# Patient Record
Sex: Female | Born: 1995 | Race: Black or African American | Hispanic: No | Marital: Single | Smoking: Never smoker
Health system: Southern US, Community
[De-identification: ages and names within clinical notes are randomized; demographics above are authoritative.]

## PROBLEM LIST (undated history)

## (undated) DIAGNOSIS — Z5189 Encounter for other specified aftercare: Secondary | ICD-10-CM

## (undated) DIAGNOSIS — D649 Anemia, unspecified: Secondary | ICD-10-CM

## (undated) HISTORY — DX: Encounter for other specified aftercare: Z51.89

## (undated) HISTORY — DX: Anemia, unspecified: D64.9

## (undated) HISTORY — PX: OTHER SURGICAL HISTORY: SHX169

---
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
## 2014-06-08 ENCOUNTER — Ambulatory Visit: Payer: BC Managed Care – PPO | Admitting: Family Medicine

## 2014-06-29 ENCOUNTER — Encounter (HOSPITAL_COMMUNITY): Payer: Self-pay | Admitting: Physical Medicine and Rehabilitation

## 2014-06-29 ENCOUNTER — Emergency Department (HOSPITAL_COMMUNITY): Payer: BC Managed Care – PPO

## 2014-06-29 ENCOUNTER — Emergency Department (HOSPITAL_COMMUNITY)
Admission: EM | Admit: 2014-06-29 | Discharge: 2014-06-29 | Disposition: A | Discharge status: Home / Self Care | Payer: BC Managed Care – PPO | Attending: Emergency Medicine | Admitting: Emergency Medicine

## 2014-06-29 DIAGNOSIS — R109 Unspecified abdominal pain: Secondary | ICD-10-CM | POA: Diagnosis present

## 2014-06-29 DIAGNOSIS — N83201 Unspecified ovarian cyst, right side: Secondary | ICD-10-CM

## 2014-06-29 DIAGNOSIS — N938 Other specified abnormal uterine and vaginal bleeding: Secondary | ICD-10-CM

## 2014-06-29 DIAGNOSIS — Z3202 Encounter for pregnancy test, result negative: Secondary | ICD-10-CM | POA: Insufficient documentation

## 2014-06-29 DIAGNOSIS — D500 Iron deficiency anemia secondary to blood loss (chronic): Secondary | ICD-10-CM | POA: Diagnosis not present

## 2014-06-29 DIAGNOSIS — N8329 Other ovarian cysts: Secondary | ICD-10-CM | POA: Insufficient documentation

## 2014-06-29 DIAGNOSIS — D62 Acute posthemorrhagic anemia: Secondary | ICD-10-CM

## 2014-06-29 LAB — CBC WITH DIFFERENTIAL/PLATELET
BASOS ABS: 0 10*3/uL (ref 0.0–0.1)
Basophils Relative: 1 % (ref 0–1)
EOS ABS: 0 10*3/uL (ref 0.0–0.7)
EOS PCT: 0 % (ref 0–5)
HCT: 27.4 % — ABNORMAL LOW (ref 36.0–46.0)
Hemoglobin: 8.3 g/dL — ABNORMAL LOW (ref 12.0–15.0)
Lymphocytes Relative: 33 % (ref 12–46)
Lymphs Abs: 1.5 10*3/uL (ref 0.7–4.0)
MCH: 23.8 pg — AB (ref 26.0–34.0)
MCHC: 30.3 g/dL (ref 30.0–36.0)
MCV: 78.5 fL (ref 78.0–100.0)
Monocytes Absolute: 0.6 10*3/uL (ref 0.1–1.0)
Monocytes Relative: 12 % (ref 3–12)
Neutro Abs: 2.6 10*3/uL (ref 1.7–7.7)
Neutrophils Relative %: 54 % (ref 43–77)
PLATELETS: 409 10*3/uL — AB (ref 150–400)
RBC: 3.49 MIL/uL — ABNORMAL LOW (ref 3.87–5.11)
RDW: 16.7 % — AB (ref 11.5–15.5)
WBC: 4.7 10*3/uL (ref 4.0–10.5)

## 2014-06-29 LAB — WET PREP, GENITAL
CLUE CELLS WET PREP: NONE SEEN
TRICH WET PREP: NONE SEEN
YEAST WET PREP: NONE SEEN

## 2014-06-29 LAB — TYPE AND SCREEN
ABO/RH(D): A POS
ANTIBODY SCREEN: NEGATIVE

## 2014-06-29 LAB — URINE MICROSCOPIC-ADD ON

## 2014-06-29 LAB — URINALYSIS, ROUTINE W REFLEX MICROSCOPIC
Bilirubin Urine: NEGATIVE
Glucose, UA: NEGATIVE mg/dL
Ketones, ur: NEGATIVE mg/dL
LEUKOCYTES UA: NEGATIVE
Nitrite: NEGATIVE
PH: 7 (ref 5.0–8.0)
Protein, ur: 30 mg/dL — AB
Specific Gravity, Urine: 1.011 (ref 1.005–1.030)
Urobilinogen, UA: 0.2 mg/dL (ref 0.0–1.0)

## 2014-06-29 LAB — COMPREHENSIVE METABOLIC PANEL
ALT: 17 U/L (ref 0–35)
AST: 17 U/L (ref 0–37)
Albumin: 4.2 g/dL (ref 3.5–5.2)
Alkaline Phosphatase: 71 U/L (ref 39–117)
Anion gap: 13 (ref 5–15)
BUN: 5 mg/dL — ABNORMAL LOW (ref 6–23)
CO2: 21 meq/L (ref 19–32)
Calcium: 9.5 mg/dL (ref 8.4–10.5)
Chloride: 104 mEq/L (ref 96–112)
Creatinine, Ser: 0.5 mg/dL (ref 0.50–1.10)
GFR calc Af Amer: >90 mL/min (ref >90)
Glucose, Bld: 86 mg/dL (ref 70–99)
Potassium: 3.9 mEq/L (ref 3.7–5.3)
SODIUM: 138 meq/L (ref 137–147)
Total Bilirubin: 0.5 mg/dL (ref 0.3–1.2)
Total Protein: 7.4 g/dL (ref 6.0–8.3)

## 2014-06-29 LAB — PREGNANCY, URINE: Preg Test, Ur: NEGATIVE

## 2014-06-29 MED ORDER — MEGESTROL ACETATE 40 MG PO TABS: 120.0000 mg | ORAL_TABLET | Freq: Three times a day (TID) | ORAL | Status: DC

## 2014-06-29 MED ORDER — HYDROCODONE-ACETAMINOPHEN 5-325 MG PO TABS: 1.0000 | ORAL_TABLET | ORAL | Status: DC | PRN

## 2014-06-29 MED ORDER — MEGESTROL ACETATE 40 MG PO TABS
120.0000 mg | ORAL_TABLET | Freq: Once | ORAL | Status: AC
  Administered 2014-06-29: 120 mg via ORAL
  Filled 2014-06-29: qty 3

## 2014-06-29 MED ORDER — HYDROCODONE-ACETAMINOPHEN 5-325 MG PO TABS
1.0000 | ORAL_TABLET | Freq: Once | ORAL | Status: AC
  Administered 2014-06-29: 1 via ORAL
  Filled 2014-06-29: qty 1

## 2014-06-29 NOTE — ED Notes (Signed)
Patient returned from CT

## 2014-06-29 NOTE — ED Notes (Signed)
Pt presents to department for evaluation of R sided abdominal pain, anemia, and hematuria. Ongoing x3 days. 9/10 pain upon arrival. Was seen at school physician today, abnormal labs noted, referred to ED. Hemoglobin of 8.6, large amount of blood in urine. Pt is alert and oriented x4.

## 2014-06-29 NOTE — ED Provider Notes (Signed)
CSN: [id number]     Arrival date & time 06/29/14  1338 History   First MD Initiated Contact with Patient 06/29/14 1437     Chief Complaint  Patient presents with  . Abdominal Pain  . Hematuria     (Consider location/radiation/quality/duration/timing/severity/associated sxs/prior Treatment) HPI Patient with H/O recurrent anemia of unknown source. She reports H/O hematology consult, GI consult with upper and lower endoscopy. Denies H/O heavy menses. Last menses 11/26, denies excessive bleeding or clots. Reports recently getting more fatigue and dizziness with activity. For several days has had moderated RLQ pain. Has H/O ovarian cyst. Today noted blood in urine.  Initially the patient did not endorse any significant amount of menstrual bleeding. When we prepared to do the pelvic examination she removed a pad and reported she had blood dripping down her legs. She then reported that she had had vaginal bleeding since yesterday and has changed her pad twice today. Past Medical History  Diagnosis Date  . Anemia     4 admissions for blood transfusions.  . Blood transfusion without reported diagnosis     s/p 4 blood transfusions since age 54.   Past Surgical History  Procedure Laterality Date  . Admissions      4 separate blood transfusions for severe anemia requiring blood transfusions.  . Esophagogastroduodenoscopy  10/2012    negative; [place]; admission for severe anemia requiring blood transfusion.  . Colonoscopy  10/2012    negative; admission for severe anemia requiring blood transfusion.   No family history on file. History  Substance Use Topics  . Smoking status: Never Smoker   . Smokeless tobacco: Never Used  . Alcohol Use: No   OB History    No data available     Review of Systems   10 Systems reviewed and are negative for acute change except as noted in the HPI. Allergies  Review of patient's allergies indicates no known allergies.  Home Medications   Prior to  Admission medications   Medication Sig Start Date End Date Taking? Authorizing Provider  acetaminophen (TYLENOL) 325 MG tablet Take 650 mg by mouth every 6 (six) hours as needed for mild pain or fever.   Yes Historical Provider, MD  ferrous sulfate 325 (65 FE) MG tablet Take 1 tablet (325 mg total) by mouth 3 (three) times daily. 08/22/13  Yes Jamal Collin, MD  ciprofloxacin (CIPRO) 250 MG tablet Take 1 tablet (250 mg total) by mouth 2 (two) times daily. Take for 3 days Patient not taking: Reported on 06/29/2014 10/16/13   Lonia Skinner, MD  HYDROcodone-acetaminophen (NORCO/VICODIN) 5-325 MG per tablet Take 1-2 tablets by mouth every 4 (four) hours as needed for moderate pain or severe pain. 06/29/14   Arby Barrette, MD  megestrol (MEGACE) 40 MG tablet Take 3 tablets (120 mg total) by mouth 3 (three) times daily. 3 tabs TID for 5 days, 2 tabs BID for 5 days, 1 daily until seen for recheck. 06/29/14   Arby Barrette, MD   BP 111/64 mmHg  Pulse 80  Temp(Src) 97.9 F (36.6 C) (Axillary)  Resp 18  Ht 5\' 5"  (1.651 m)  Wt 115 lb (52.164 kg)  BMI 19.14 kg/m2  SpO2 100% Physical Exam  Constitutional: She is oriented to person, place, and time. She appears well-developed and well-nourished.  Well appearance. No distress.  HENT:  Head: Normocephalic and atraumatic.  Eyes: EOM are normal. Pupils are equal, round, and reactive to light.  Neck: Neck supple.  Cardiovascular: Normal rate,  regular rhythm, normal heart sounds and intact distal pulses.   Pulmonary/Chest: Effort normal and breath sounds normal.  Abdominal: Soft. Bowel sounds are normal. She exhibits no distension. There is tenderness (mildly reproducible RLQ pain. No guarding.).  Genitourinary:  Vaginal examination: Speculum pooling blood in the vaginal vault. This is red and thin consistent with menstrual bleeding. There are no clots present. The os is closed.  Musculoskeletal: Normal range of motion. She exhibits no edema.   Neurological: She is alert and oriented to person, place, and time. She has normal strength. Coordination normal. GCS eye subscore is 4. GCS verbal subscore is 5. GCS motor subscore is 6.  Skin: Skin is warm, dry and intact.  Psychiatric: She has a normal mood and affect.    ED Course  Procedures (including critical care time) Labs Review Labs Reviewed  WET PREP, GENITAL - Abnormal; Notable for the following:    WBC, Wet Prep HPF POC FEW (*)    All other components within normal limits  URINALYSIS, ROUTINE W REFLEX MICROSCOPIC - Abnormal; Notable for the following:    Color, Urine RED (*)    APPearance TURBID (*)    Hgb urine dipstick LARGE (*)    Protein, ur 30 (*)    All other components within normal limits  COMPREHENSIVE METABOLIC PANEL - Abnormal; Notable for the following:    BUN 5 (*)    All other components within normal limits  CBC WITH DIFFERENTIAL - Abnormal; Notable for the following:    RBC 3.49 (*)    Hemoglobin 8.3 (*)    HCT 27.4 (*)    MCH 23.8 (*)    RDW 16.7 (*)    Platelets 409 (*)    All other components within normal limits  URINE MICROSCOPIC-ADD ON - Abnormal; Notable for the following:    Squamous Epithelial / LPF FEW (*)    All other components within normal limits  GC/CHLAMYDIA PROBE AMP  PREGNANCY, URINE  TYPE AND SCREEN    Imaging Review Ct Renal Stone Study  06/29/2014   CLINICAL DATA:  Right flank pain of 3 days duration. Gross hematuria last night. Vomiting today.  EXAM: CT ABDOMEN AND PELVIS WITHOUT CONTRAST  TECHNIQUE: Multidetector CT imaging of the abdomen and pelvis was performed following the standard protocol without IV contrast.  COMPARISON:  None.  FINDINGS: Lung bases are clear. No pleural or pericardial fluid. The liver, spleen, gallbladder, pancreas, adrenal glands, aorta and IVC appear normal without contrast. No bowel pathology is seen. Gas-filled appendix appears unremarkable.  Both kidneys appear normal. No renal calculi. No  hydroureteronephrosis. No stones seen along the course of either ureter.  No bony abnormality.  No stone in the bladder. Uterus appears unremarkable. 3 cm cyst associated with the right ovary, presumably functional.  IMPRESSION: No evidence of urinary tract stone disease. No cause of hematuria identified.  3 cm right ovarian cyst, presumably functional.   Electronically Signed   By: Paulina FusiMark  Shogry M.D.   On: 06/29/2014 16:36     EKG Interpretation None     Patient's case is reviewed with Dr[facility] Adrian [facility]Stinson at [facility]. The patient will be started on Megace with a tapering dose. She will be contacted to schedule a follow-up GYN appointment. MDM   Final diagnoses:  Dysfunctional uterine bleeding  Anemia due to acute blood loss  Cyst of right ovary   The patient is not pregnant. She is not orthostatic. At this point it does appear that the source of  blood loss is vaginal bleeding. The patient will be treated with Megace tapering. She'll be given signs and symptoms for which to return.    Arby BarretteMarcy Jaedan Schuman, MD 06/29/14 [phone]

## 2014-06-29 NOTE — Discharge Instructions (Signed)
Abnormal Uterine Bleeding Abnormal uterine bleeding means bleeding from the vagina that is not your normal menstrual period. This can be:  Bleeding or spotting between periods.  Bleeding after sex (sexual intercourse).  Bleeding that is heavier or more than normal.  Periods that last longer than usual.  Bleeding after menopause. There are many problems that may cause this. Treatment will depend on the cause of the bleeding. Any kind of bleeding that is not normal should be reviewed by your doctor.  HOME CARE Watch your condition for any changes. These actions may lessen any discomfort you are having:  Do not use tampons or douches as told by your doctor.  Change your pads often. You should get regular pelvic exams and Pap tests. Keep all appointments for tests as told by your doctor. GET HELP IF:  You are bleeding for more than 1 week.  You feel dizzy at times. GET HELP RIGHT AWAY IF:   You pass out.  You have to change pads every 15 to 30 minutes.  You have belly pain.  You have a fever.  You become sweaty or weak.  You are passing large blood clots from the vagina.  You feel sick to your stomach (nauseous) and throw up (vomit). MAKE SURE YOU:  Understand these instructions.  Will watch your condition.  Will get help right away if you are not doing well or get worse. Document Released: 05/07/2009 Document Revised: 07/15/2013 Document Reviewed: 02/06/2013 [facility] Patient Information 2015 [place], [place]. This information is not intended to replace advice given to you by your health care provider. Make sure you discuss any questions you have with your health care provider.  Anemia, Nonspecific Anemia is a condition in which the concentration of red blood cells or hemoglobin in the blood is below normal. Hemoglobin is a substance in red blood cells that carries oxygen to the tissues of the body. Anemia results in not enough oxygen reaching these tissues.  CAUSES    Common causes of anemia include:   Excessive bleeding. Bleeding may be internal or external. This includes excessive bleeding from periods (in women) or from the intestine.   Poor nutrition.   Chronic kidney, thyroid, and liver disease.  Bone marrow disorders that decrease red blood cell production.  Cancer and treatments for cancer.  HIV, AIDS, and their treatments.  Spleen problems that increase red blood cell destruction.  Blood disorders.  Excess destruction of red blood cells due to infection, medicines, and autoimmune disorders. SIGNS AND SYMPTOMS   Minor weakness.   Dizziness.   Headache.  Palpitations.   Shortness of breath, especially with exercise.   Paleness.  Cold sensitivity.  Indigestion.  Nausea.  Difficulty sleeping.  Difficulty concentrating. Symptoms may occur suddenly or they may develop slowly.  DIAGNOSIS  Additional blood tests are often needed. These help your health care provider determine the best treatment. Your health care provider will check your stool for blood and look for other causes of blood loss.  TREATMENT  Treatment varies depending on the cause of the anemia. Treatment can include:   Supplements of iron, vitamin H41, or folic acid.   Hormone medicines.   A blood transfusion. This may be needed if blood loss is severe.   Hospitalization. This may be needed if there is significant continual blood loss.   Dietary changes.  Spleen removal. HOME CARE INSTRUCTIONS Keep all follow-up appointments. It often takes many weeks to correct anemia, and having your health care provider check on your  condition and your response to treatment is very important. °SEEK IMMEDIATE MEDICAL CARE IF:  °· You develop extreme weakness, shortness of breath, or chest pain.   °· You become dizzy or have trouble concentrating. °· You develop heavy vaginal bleeding.   °· You develop a rash.   °· You have bloody or black, tarry stools.    °· You faint.   °· You vomit up blood.   °· You vomit repeatedly.   °· You have abdominal pain. °· You have a fever or persistent symptoms for more than 2-3 days.   °· You have a fever and your symptoms suddenly get worse.   °· You are dehydrated.   °MAKE SURE YOU: °· Understand these instructions. °· Will watch your condition. °· Will get help right away if you are not doing well or get worse. °Document Released: 08/17/2004 Document Revised: 03/12/2013 Document Reviewed: 01/03/2013 °[employer]® Patient Information ©2015 [employer]. This information is not intended to replace advice given to you by your health care provider. Make sure you discuss any questions you have with your health care provider. ° °

## 2014-06-29 NOTE — ED Notes (Signed)
MD Pfieffer at the bedside.  

## 2014-06-30 ENCOUNTER — Encounter: Payer: Self-pay | Admitting: Obstetrics & Gynecology

## 2014-07-01 LAB — GC/CHLAMYDIA PROBE AMP
CT Probe RNA: NEGATIVE
GC Probe RNA: NEGATIVE

## 2014-07-22 ENCOUNTER — Encounter: Payer: BC Managed Care – PPO | Admitting: Obstetrics & Gynecology

## 2014-10-20 ENCOUNTER — Observation Stay (HOSPITAL_COMMUNITY)
Admission: EM | Admit: 2014-10-20 | Discharge: 2014-10-22 | Disposition: A | Discharge status: Home / Self Care | Payer: BLUE CROSS/BLUE SHIELD | Attending: Internal Medicine | Admitting: Internal Medicine

## 2014-10-20 ENCOUNTER — Encounter (HOSPITAL_COMMUNITY): Payer: Self-pay | Admitting: Emergency Medicine

## 2014-10-20 ENCOUNTER — Other Ambulatory Visit (HOSPITAL_COMMUNITY): Payer: Self-pay

## 2014-10-20 DIAGNOSIS — G43909 Migraine, unspecified, not intractable, without status migrainosus: Secondary | ICD-10-CM | POA: Insufficient documentation

## 2014-10-20 DIAGNOSIS — R42 Dizziness and giddiness: Secondary | ICD-10-CM | POA: Diagnosis not present

## 2014-10-20 DIAGNOSIS — D509 Iron deficiency anemia, unspecified: Secondary | ICD-10-CM | POA: Diagnosis not present

## 2014-10-20 DIAGNOSIS — D649 Anemia, unspecified: Secondary | ICD-10-CM

## 2014-10-20 LAB — RAPID URINE DRUG SCREEN, HOSP PERFORMED
Amphetamines: NOT DETECTED
BENZODIAZEPINES: NOT DETECTED
Barbiturates: NOT DETECTED
Cocaine: NOT DETECTED
Opiates: NOT DETECTED
Tetrahydrocannabinol: NOT DETECTED

## 2014-10-20 LAB — BASIC METABOLIC PANEL
Anion gap: 5 (ref 5–15)
BUN: 8 mg/dL (ref 6–23)
CO2: 25 mmol/L (ref 19–32)
Calcium: 8.8 mg/dL (ref 8.4–10.5)
Chloride: 108 mmol/L (ref 96–112)
Creatinine, Ser: 0.55 mg/dL (ref 0.50–1.10)
GFR calc Af Amer: >90 mL/min (ref >90)
GLUCOSE: 88 mg/dL (ref 70–99)
Potassium: 4 mmol/L (ref 3.5–5.1)
Sodium: 138 mmol/L (ref 135–145)

## 2014-10-20 LAB — ABO/RH: ABO/RH(D): A POS

## 2014-10-20 LAB — CBC
HEMATOCRIT: 25.9 % — AB (ref 36.0–46.0)
HEMOGLOBIN: 7.3 g/dL — AB (ref 12.0–15.0)
MCH: 20.8 pg — ABNORMAL LOW (ref 26.0–34.0)
MCHC: 28.2 g/dL — AB (ref 30.0–36.0)
MCV: 73.8 fL — ABNORMAL LOW (ref 78.0–100.0)
Platelets: 275 10*3/uL (ref 150–400)
RBC: 3.51 MIL/uL — ABNORMAL LOW (ref 3.87–5.11)
RDW: 21.5 % — AB (ref 11.5–15.5)
WBC: 4.4 10*3/uL (ref 4.0–10.5)

## 2014-10-20 LAB — PREGNANCY, URINE: PREG TEST UR: NEGATIVE

## 2014-10-20 LAB — PREPARE RBC (CROSSMATCH)

## 2014-10-20 MED ORDER — SODIUM CHLORIDE 0.9 % IV SOLN
Freq: Once | INTRAVENOUS | Status: AC
  Administered 2014-10-20: via INTRAVENOUS

## 2014-10-20 MED ORDER — ONDANSETRON HCL 4 MG/2ML IJ SOLN
4.0000 mg | Freq: Four times a day (QID) | INTRAMUSCULAR | Status: DC | PRN
  Administered 2014-10-21: 4 mg via INTRAVENOUS

## 2014-10-20 MED ORDER — SODIUM CHLORIDE 0.9 % IV SOLN
INTRAVENOUS | Status: AC
  Administered 2014-10-20 – 2014-10-21 (×2): via INTRAVENOUS

## 2014-10-20 MED ORDER — ACETAMINOPHEN 650 MG RE SUPP: 650.0000 mg | Freq: Four times a day (QID) | RECTAL | Status: DC | PRN

## 2014-10-20 MED ORDER — ONDANSETRON HCL 4 MG PO TABS: 4.0000 mg | ORAL_TABLET | Freq: Four times a day (QID) | ORAL | Status: DC | PRN

## 2014-10-20 MED ORDER — FERROUS SULFATE 325 (65 FE) MG PO TABS
325.0000 mg | ORAL_TABLET | Freq: Three times a day (TID) | ORAL | Status: DC
  Administered 2014-10-20 – 2014-10-22 (×5): 325 mg via ORAL
  Filled 2014-10-20 (×7): qty 1

## 2014-10-20 MED ORDER — ONDANSETRON HCL 4 MG/2ML IJ SOLN
4.0000 mg | Freq: Three times a day (TID) | INTRAMUSCULAR | Status: AC | PRN
Start: 2014-10-20 — End: 2014-10-21
  Filled 2014-10-20: qty 2

## 2014-10-20 MED ORDER — ACETAMINOPHEN 325 MG PO TABS
650.0000 mg | ORAL_TABLET | Freq: Four times a day (QID) | ORAL | Status: DC | PRN
  Administered 2014-10-21 – 2014-10-22 (×3): 650 mg via ORAL
  Filled 2014-10-20 (×3): qty 2

## 2014-10-20 MED ORDER — ENSURE ENLIVE PO LIQD
237.0000 mL | Freq: Two times a day (BID) | ORAL | Status: DC
  Administered 2014-10-21 – 2014-10-22 (×3): 237 mL via ORAL

## 2014-10-20 NOTE — ED Notes (Signed)
Pt states she was seen by her doctor on Friday and her hemoglobin was 8.2 and her iron level was low  Pt states they recommend that she see a hematologist Pt states today she has been feeling dizzy and seeing black spots so she called her dr and was told to come here

## 2014-10-20 NOTE — H&P (Addendum)
[facility] History and Physical  Lindsey RabonSanobria Szczepanik WUJ:[id number] DOB: 1995-10-28 DOA: 10/20/2014  Referring physician: ER physician. PCP: Saralyn PilarKaramalegos, Alexander, DO   Chief Complaint: Dizziness.  HPI: Lindsey Acevedo is a 19 y.o. female history of chronic iron deficiency anemia and has had previous extensive workup including colonoscopy and had [place] presents to the ER because of dizziness and weakness. Patient states that over the last 2-3 days patient has been feeling weak and dizzy and also had some blurred vision today. 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 Patient denies any chest pain shortness of breath headache nausea vomiting diarrhea focal deficits. In the ER patient is found to be orthostatic. Patient's hemoglobin is around 7.3 at admission and the previous one was around 8. Patient has been taking iron pills despite which patient's hemoglobin has not improved. Patient denies any menorrhagia or rectal bleeding.  Review of Systems: As presented in the history of presenting illness, rest negative.  Past Medical History  Diagnosis Date  . Anemia     4 admissions for blood transfusions.  . Blood transfusion without reported diagnosis     s/p 4 blood transfusions since age 19.   Past Surgical History  Procedure Laterality Date  . Admissions      4 separate blood transfusions for severe anemia requiring blood transfusions.  . Esophagogastroduodenoscopy  10/2012    negative; [place]; admission for severe anemia requiring blood transfusion.  . Colonoscopy  10/2012    negative; admission for severe anemia requiring blood transfusion.   Social History:  reports that she has never smoked. She has never used smokeless tobacco. She reports that she does not drink alcohol or use illicit drugs. Where does patient live home. Can patient participate in ADLs? Yes.  No Known Allergies  Family  History:  Family History  Problem Relation Age of Onset  . Colon cancer Neg Hx   . Hypertension Maternal Grandmother       Prior to Admission medications   Medication Sig Start Date End Date Taking? Authorizing Provider  acetaminophen (TYLENOL) 325 MG tablet Take 650 mg by mouth every 6 (six) hours as needed for mild pain or fever (pain).    Yes Historical Provider, MD  ferrous sulfate 325 (65 FE) MG tablet Take 1 tablet (325 mg total) by mouth 3 (three) times daily. 08/22/13  Yes Jamal CollinJames R Joyner, MD  ciprofloxacin (CIPRO) 250 MG tablet Take 1 tablet (250 mg total) by mouth 2 (two) times daily. Take for 3 days Patient not taking: Reported on 06/29/2014 10/16/13   Lonia SkinnerStephanie E Losq, MD  HYDROcodone-acetaminophen (NORCO/VICODIN) 5-325 MG per tablet Take 1-2 tablets by mouth every 4 (four) hours as needed for moderate pain or severe pain. Patient not taking: Reported on 10/20/2014 06/29/14   Arby BarretteMarcy Pfeiffer, MD  megestrol (MEGACE) 40 MG tablet Take 3 tablets (120 mg total) by mouth 3 (three) times daily. 3 tabs TID for 5 days, 2 tabs BID for 5 days, 1 daily until seen for recheck. Patient not taking: Reported on 10/20/2014 06/29/14   Arby BarretteMarcy Pfeiffer, MD    Physical Exam: Filed Vitals:   10/20/14 1917  BP: 115/62  Pulse: 83  Temp: 98.4 F (36.9 C)  TempSrc: Oral  Resp: 16  Height: 5\' 5"  (1.651 m)  Weight: 53.978 kg (119 lb)  SpO2: 100%     General:  Moderately built and nourished.  Eyes: Anicteric pallor present.  ENT:  No discharge from the ears eyes nose or mouth.  Neck: No mass felt.  Cardiovascular: S1-S2 heard.  Respiratory: No rhonchi or crepitations.  Abdomen: Soft nontender bowel sounds present.  Skin: No rash.  Musculoskeletal: No edema.  Psychiatric: Appears normal.  Neurologic: Alert awake oriented to time place and person. Moves all extremities.  Labs on Admission:  Basic Metabolic Panel:  Recent Labs Lab 10/20/14 1928  NA 138  K 4.0  CL 108  CO2 25   GLUCOSE 88  BUN 8  CREATININE 0.55  CALCIUM 8.8   Liver Function Tests: No results for input(s): AST, ALT, ALKPHOS, BILITOT, PROT, ALBUMIN in the last 168 hours. No results for input(s): LIPASE, AMYLASE in the last 168 hours. No results for input(s): AMMONIA in the last 168 hours. CBC:  Recent Labs Lab 10/20/14 1928  WBC 4.4  HGB 7.3*  HCT 25.9*  MCV 73.8*  PLT 275   Cardiac Enzymes: No results for input(s): CKTOTAL, CKMB, CKMBINDEX, TROPONINI in the last 168 hours.  BNP (last 3 results) No results for input(s): BNP in the last 8760 hours.  ProBNP (last 3 results) No results for input(s): PROBNP in the last 8760 hours.  CBG: No results for input(s): GLUCAP in the last 168 hours.  Radiological Exams on Admission: No results found.   Assessment/Plan Active Problems:   Iron deficiency anemia   Dizziness   1. Dizziness secondary to symptomatic anemia - at this time I have discussed the on-call hematologist Dr. Clelia Croft. Since patient is symptomatic at this time we will transfuse 1 unit of PRBC and continue with gentle hydration and recheck CBC in a.m. Closely monitor in telemetry to rule out any arrhythmias. Recheck orthostatics in a.m. after transfusion. Dr. Clelia Croft has advised to have patient follow up with hematologist as outpatient and may need IV iron therapy and further workup once patient visits hematology office. 2. Chronic iron deficiency anemia - see #1.  Addendum - EKG shows sinus arrhythmia.  DVT Prophylaxis SCDs.  Code Status: Full code.  Family Communication: None.  Disposition Plan: Admit for observation.    Park Beck N. [facility] Pager [phone].  If 7PM-7AM, please contact night-coverage [email or web address] Password [facility] 10/20/2014, 10:05 PM

## 2014-10-20 NOTE — ED Provider Notes (Signed)
CSN: [id number]     Arrival date & time 10/20/14  1908 History   First MD Initiated Contact with Patient 10/20/14 2048     Chief Complaint  Patient presents with  . Dizziness     (Consider location/radiation/quality/duration/timing/severity/associated sxs/prior Treatment) HPI Comments: The patient is an 19 year old female, she has a history of anemia which appears to be iron deficiency in the past, she has been seen and evaluated by gastroenterology, hematology, she is originally from [place] and is here at school. Since being here she has required a transfusion of blood products one year ago when she was admitted to the hospital for symptomatically anemia. She had a blood draw on Friday, reports that her hemoglobin was 8.2, over the last 24 hours she has had increased lightheadedness and almost had a syncopal event while she was driving after seeing black spots. At this time the patient feels generally weak and fatigued, denies any other symptoms including fevers, chills, nausea, vomiting, headache, chest pain, cough, shortness of breath, abdominal pain, dysuria or diarrhea, no rashes or swelling, no numbness, no focal weakness. She does report having normal menstrual cycles, this is regular, monthly, last 4 days, changes 3 tampons or pads per day. There has been no change. She was also told that her iron level was low and that she was not holding onto this by oral route and may need IV iron.   Patient is a 19 y.o. female presenting with dizziness. The history is provided by the patient.  Dizziness   Past Medical History  Diagnosis Date  . Anemia     4 admissions for blood transfusions.  . Blood transfusion without reported diagnosis     s/p 4 blood transfusions since age 62.   Past Surgical History  Procedure Laterality Date  . Admissions      4 separate blood transfusions for severe anemia requiring blood transfusions.  . Esophagogastroduodenoscopy  10/2012    negative; [place];  admission for severe anemia requiring blood transfusion.  . Colonoscopy  10/2012    negative; admission for severe anemia requiring blood transfusion.   History reviewed. No pertinent family history. History  Substance Use Topics  . Smoking status: Never Smoker   . Smokeless tobacco: Never Used  . Alcohol Use: No   OB History    No data available     Review of Systems  Neurological: Positive for dizziness.  All other systems reviewed and are negative.     Allergies  Review of patient's allergies indicates no known allergies.  Home Medications   Prior to Admission medications   Medication Sig Start Date End Date Taking? Authorizing Provider  acetaminophen (TYLENOL) 325 MG tablet Take 650 mg by mouth every 6 (six) hours as needed for mild pain or fever (pain).    Yes Historical Provider, MD  ferrous sulfate 325 (65 FE) MG tablet Take 1 tablet (325 mg total) by mouth 3 (three) times daily. 08/22/13  Yes Jamal Collin, MD  ciprofloxacin (CIPRO) 250 MG tablet Take 1 tablet (250 mg total) by mouth 2 (two) times daily. Take for 3 days Patient not taking: Reported on 06/29/2014 10/16/13   Lonia Skinner, MD  HYDROcodone-acetaminophen (NORCO/VICODIN) 5-325 MG per tablet Take 1-2 tablets by mouth every 4 (four) hours as needed for moderate pain or severe pain. Patient not taking: Reported on 10/20/2014 06/29/14   Arby Barrette, MD  megestrol (MEGACE) 40 MG tablet Take 3 tablets (120 mg total) by mouth 3 (three) times daily.  3 tabs TID for 5 days, 2 tabs BID for 5 days, 1 daily until seen for recheck. Patient not taking: Reported on 10/20/2014 06/29/14   Arby BarretteMarcy Pfeiffer, MD   BP 115/62 mmHg  Pulse 83  Temp(Src) 98.4 F (36.9 C) (Oral)  Resp 16  Ht 5\' 5"  (1.651 m)  Wt 119 lb (53.978 kg)  BMI 19.80 kg/m2  SpO2 100%  LMP 10/18/2014 (Exact Date) Physical Exam  Constitutional: She appears well-developed and well-nourished. No distress.  HENT:  Head: Normocephalic and atraumatic.   Mouth/Throat: Oropharynx is clear and moist. No oropharyngeal exudate.  Eyes: EOM are normal. Pupils are equal, round, and reactive to light. Right eye exhibits no discharge. Left eye exhibits no discharge. No scleral icterus.  Pale conjunctiva  Neck: Normal range of motion. Neck supple. No JVD present. No thyromegaly present.  Cardiovascular: Normal rate, regular rhythm, normal heart sounds and intact distal pulses.  Exam reveals no gallop and no friction rub.   No murmur heard. Pulmonary/Chest: Effort normal and breath sounds normal. No respiratory distress. She has no wheezes. She has no rales.  Abdominal: Soft. Bowel sounds are normal. She exhibits no distension and no mass. There is no tenderness.  Musculoskeletal: Normal range of motion. She exhibits no edema or tenderness.  Lymphadenopathy:    She has no cervical adenopathy.  Neurological: She is alert. Coordination normal.  Skin: Skin is warm and dry. No rash noted. No erythema.  Psychiatric: She has a normal mood and affect. Her behavior is normal.  Nursing note and vitals reviewed.   ED Course  Procedures (including critical care time) Labs Review Labs Reviewed  CBC - Abnormal; Notable for the following:    RBC 3.51 (*)    Hemoglobin 7.3 (*)    HCT 25.9 (*)    MCV 73.8 (*)    MCH 20.8 (*)    MCHC 28.2 (*)    RDW 21.5 (*)    All other components within normal limits  BASIC METABOLIC PANEL  PREGNANCY, URINE    Imaging Review No results found.   MDM   Final diagnoses:  Anemia, unspecified anemia type    The patient appears well, her vital signs are normal, she does have significant anemia with a hemoglobin of 7.3, she will need admission to the hospital for transfusion.  D/w Dr. Toniann Failkakrakandy - willa dmit.    Eber HongBrian Yaman Grauberger, MD 10/20/14 2126

## 2014-10-21 DIAGNOSIS — R42 Dizziness and giddiness: Secondary | ICD-10-CM | POA: Diagnosis not present

## 2014-10-21 DIAGNOSIS — D509 Iron deficiency anemia, unspecified: Secondary | ICD-10-CM | POA: Diagnosis not present

## 2014-10-21 DIAGNOSIS — G43909 Migraine, unspecified, not intractable, without status migrainosus: Secondary | ICD-10-CM | POA: Diagnosis not present

## 2014-10-21 LAB — CBC WITH DIFFERENTIAL/PLATELET
Basophils Absolute: 0 10*3/uL (ref 0.0–0.1)
Basophils Relative: 1 % (ref 0–1)
EOS ABS: 0.1 10*3/uL (ref 0.0–0.7)
EOS PCT: 2 % (ref 0–5)
HCT: 27.4 % — ABNORMAL LOW (ref 36.0–46.0)
Hemoglobin: 8.1 g/dL — ABNORMAL LOW (ref 12.0–15.0)
Lymphocytes Relative: 45 % (ref 12–46)
Lymphs Abs: 2.2 10*3/uL (ref 0.7–4.0)
MCH: 22.5 pg — AB (ref 26.0–34.0)
MCHC: 29.6 g/dL — AB (ref 30.0–36.0)
MCV: 76.1 fL — ABNORMAL LOW (ref 78.0–100.0)
MONO ABS: 0.7 10*3/uL (ref 0.1–1.0)
Monocytes Relative: 14 % — ABNORMAL HIGH (ref 3–12)
NEUTROS ABS: 1.8 10*3/uL (ref 1.7–7.7)
Neutrophils Relative %: 38 % — ABNORMAL LOW (ref 43–77)
Platelets: 251 10*3/uL (ref 150–400)
RBC: 3.6 MIL/uL — ABNORMAL LOW (ref 3.87–5.11)
RDW: 22.4 % — ABNORMAL HIGH (ref 11.5–15.5)
WBC: 4.8 10*3/uL (ref 4.0–10.5)

## 2014-10-21 LAB — COMPREHENSIVE METABOLIC PANEL
ALT: 19 U/L (ref 0–35)
AST: 22 U/L (ref 0–37)
Albumin: 3.9 g/dL (ref 3.5–5.2)
Alkaline Phosphatase: 62 U/L (ref 39–117)
Anion gap: 5 (ref 5–15)
BUN: 11 mg/dL (ref 6–23)
CO2: 22 mmol/L (ref 19–32)
CREATININE: 0.51 mg/dL (ref 0.50–1.10)
Calcium: 8.5 mg/dL (ref 8.4–10.5)
Chloride: 109 mmol/L (ref 96–112)
GFR calc Af Amer: >90 mL/min (ref >90)
Glucose, Bld: 87 mg/dL (ref 70–99)
POTASSIUM: 3.9 mmol/L (ref 3.5–5.1)
Sodium: 136 mmol/L (ref 135–145)
TOTAL PROTEIN: 6.4 g/dL (ref 6.0–8.3)
Total Bilirubin: 0.7 mg/dL (ref 0.3–1.2)

## 2014-10-21 LAB — HEMOGLOBIN AND HEMATOCRIT, BLOOD
HEMATOCRIT: 31 % — AB (ref 36.0–46.0)
HEMOGLOBIN: 9.4 g/dL — AB (ref 12.0–15.0)

## 2014-10-21 LAB — PREPARE RBC (CROSSMATCH)

## 2014-10-21 MED ORDER — SODIUM CHLORIDE 0.9 % IV SOLN
Freq: Once | INTRAVENOUS | Status: AC
Start: 2014-10-21 — End: 2014-10-21

## 2014-10-21 NOTE — Care Management (Signed)
10/21/14 Sandford CrazeNora Khara Renaud RN,BSN,NCM [phone] CM consult for PCP. This CM spoke with pt who states she has a PCP at the [facility], and she needs a Hematologist. This CM informed pt that this would need to be a physician referral. No other CM needs noted.

## 2014-10-21 NOTE — Progress Notes (Signed)
INITIAL NUTRITION ASSESSMENT  DOCUMENTATION CODES Per approved criteria  -Not Applicable   INTERVENTION: - Continue Ensure Enlive po BID, each supplement provides 350 kcal and 20 grams of protein - RD will continue to monitor  NUTRITION DIAGNOSIS: Inadequate oral intake related to nausea as evidenced by poor po and wt loss.   Goal: Pt to meet >/= 90% of their estimated nutrition needs   Monitor:  Weight trend, po intake, acceptance of supplements, labs  Reason for Assessment: Malnutrition Screening Tool  19 y.o. female  Admitting Dx: <principal problem not specified>  ASSESSMENT: 19 y.o. female history of chronic iron deficiency anemia and has had previous extensive workup including colonoscopy and had [place] with hemat[place] presents to the ER because of dizziness and weakness.  - Pt reports poor po due to nausea. 4 lb recent weight loss. Ate one slice of bacon for breakfast and was given an Ensure by RN which she likes. Pt to order chicken salad for lunch. Will continue nutritional supplements to improve po intake.  - No signs of fat or muscle depletion.  - Labs and medications reviewed  Height: Ht Readings from Last 1 Encounters:  10/20/14 5\' 5"  (1.651 m) (61 %*, Z = 0.28)   * Growth percentiles are based on CDC 2-20 Years data.    Weight: Wt Readings from Last 1 Encounters:  10/20/14 116 lb 3.2 oz (52.708 kg) (27 %*, Z = -0.62)   * Growth percentiles are based on CDC 2-20 Years data.    Ideal Body Weight: 57 kg  % Ideal Body Weight: 92%  Wt Readings from Last 10 Encounters:  10/20/14 116 lb 3.2 oz (52.708 kg) (27 %*, Z = -0.62)  06/29/14 115 lb (52.164 kg) (25 %*, Z = -0.67)  10/15/13 115 lb 3.2 oz (52.254 kg) (28 %*, Z = -0.57)  09/04/13 114 lb (51.71 kg) (26 %*, Z = -0.63)  08/21/13 111 lb 1.8 oz (50.4 kg) (21 %*, Z = -0.82)  08/21/13 111 lb 6.4 oz (50.531 kg) (21 %*, Z = -0.80)  08/20/13 114 lb (51.71 kg) (27 %*, Z = -0.63)   * Growth  percentiles are based on CDC 2-20 Years data.    Usual Body Weight: 120 lbs  % Usual Body Weight: 97%  BMI:  Body mass index is 19.34 kg/(m^2).  Estimated Nutritional Needs: Kcal: 1400-1600 Protein: 65-75 g Fluid: 1.6 L/day  Skin: intact  Diet Order: Diet regular Room service appropriate?: Yes; Fluid consistency:: Thin  EDUCATION NEEDS: -Education needs addressed   Intake/Output Summary (Last 24 hours) at 10/21/14 1051 Last data filed at 10/21/14 0600  Gross per 24 hour  Intake    708 ml  Output      0 ml  Net    708 ml    Last BM: prior to admission   Labs:   Recent Labs Lab 10/20/14 1928 10/21/14 0535  NA 138 136  K 4.0 3.9  CL 108 109  CO2 25 22  BUN 8 11  CREATININE 0.55 0.51  CALCIUM 8.8 8.5  GLUCOSE 88 87    CBG (last 3)  No results for input(s): GLUCAP in the last 72 hours.  Scheduled Meds: . sodium chloride   Intravenous Once  . feeding supplement (ENSURE ENLIVE)  237 mL Oral BID BM  . ferrous sulfate  325 mg Oral TID    Continuous Infusions: . sodium chloride 75 mL/hr at 10/20/14 2345    Past Medical History  Diagnosis  Date  . Anemia     4 admissions for blood transfusions.  . Blood transfusion without reported diagnosis     s/p 4 blood transfusions since age 47.    Past Surgical History  Procedure Laterality Date  . Admissions      4 separate blood transfusions for severe anemia requiring blood transfusions.  . Esophagogastroduodenoscopy  10/2012    negative; [place]; admission for severe anemia requiring blood transfusion.  . Colonoscopy  10/2012    negative; admission for severe anemia requiring blood transfusion.    Emmaline Kluver MS, RD, LDN [phone]

## 2014-10-21 NOTE — Progress Notes (Signed)
[facility] PROGRESS NOTE  Lindsey Acevedo ZOX:[id number] DOB: 1995-09-27 DOA: 10/20/2014 PCP: Saralyn PilarKaramalegos, Alexander, DO  Assessment/Plan: 1. Anemia, symptomatic; iron deficiency. Patient hb on admission at 7. She has received one unit of PRBC. Hb today at 8, still symptomatic. Will transfuse another unit PRBC. She will needs to follow up with hematology outpatient.  2-Dizziness; suspect secondary to anemia. Check orthostatic vital.   Code Status: Full Code.  Family Communication: care discussed with patient  Disposition Plan: will transfuse another PRBC and reassess symptoms.    Consultants:  none  Procedures:  none  Antibiotics:  none  HPI/Subjective: Still feeling lightheaded. Weak. Her hb baseline is 10   Objective: Filed Vitals:   10/21/14 0442  BP: 105/63  Pulse: 74  Temp: 97.7 F (36.5 C)  Resp: 18    Intake/Output Summary (Last 24 hours) at 10/21/14 1303 Last data filed at 10/21/14 1211  Gross per 24 hour  Intake   1068 ml  Output      0 ml  Net   1068 ml   Filed Weights   10/20/14 1917 10/20/14 2230  Weight: 53.978 kg (119 lb) 52.708 kg (116 lb 3.2 oz)    Exam:   General:  Alert in no distress.   Cardiovascular: S 1, S 2 RRR  Respiratory: CTA  Abdomen: BS present. Soft, nt  Musculoskeletal: no edema.   Data Reviewed: Basic Metabolic Panel:  Recent Labs Lab 10/20/14 1928 10/21/14 0535  NA 138 136  K 4.0 3.9  CL 108 109  CO2 25 22  GLUCOSE 88 87  BUN 8 11  CREATININE 0.55 0.51  CALCIUM 8.8 8.5   Liver Function Tests:  Recent Labs Lab 10/21/14 0535  AST 22  ALT 19  ALKPHOS 62  BILITOT 0.7  PROT 6.4  ALBUMIN 3.9   No results for input(s): LIPASE, AMYLASE in the last 168 hours. No results for input(s): AMMONIA in the last 168 hours. CBC:  Recent Labs Lab 10/20/14 1928 10/21/14 0535  WBC 4.4 4.8  NEUTROABS  --  1.8  HGB 7.3* 8.1*  HCT 25.9* 27.4*  MCV 73.8* 76.1*  PLT 275 251   Cardiac Enzymes: No  results for input(s): CKTOTAL, CKMB, CKMBINDEX, TROPONINI in the last 168 hours. BNP (last 3 results) No results for input(s): BNP in the last 8760 hours.  ProBNP (last 3 results) No results for input(s): PROBNP in the last 8760 hours.  CBG: No results for input(s): GLUCAP in the last 168 hours.  No results found for this or any previous visit (from the past 240 hour(s)).   Studies: No results found.  Scheduled Meds: . sodium chloride   Intravenous Once  . feeding supplement (ENSURE ENLIVE)  237 mL Oral BID BM  . ferrous sulfate  325 mg Oral TID   Continuous Infusions: . sodium chloride 75 mL/hr at 10/20/14 2345    Active Problems:   Iron deficiency anemia   Dizziness    Time spent: 35 minutes.     Hartley Barefootegalado, Levonia Wolfley A  [facility] Pager [phone]. If 7PM-7AM, please contact night-coverage at [email or web address], password [email or web address] 10/21/2014, 1:03 PM

## 2014-10-21 NOTE — Progress Notes (Signed)
UR completed 

## 2014-10-22 DIAGNOSIS — R42 Dizziness and giddiness: Secondary | ICD-10-CM | POA: Diagnosis not present

## 2014-10-22 DIAGNOSIS — D509 Iron deficiency anemia, unspecified: Secondary | ICD-10-CM | POA: Diagnosis not present

## 2014-10-22 LAB — TYPE AND SCREEN
ABO/RH(D): A POS
ANTIBODY SCREEN: NEGATIVE
Unit division: 0
Unit division: 0

## 2014-10-22 LAB — CBC
HCT: 31 % — ABNORMAL LOW (ref 36.0–46.0)
HEMOGLOBIN: 9.3 g/dL — AB (ref 12.0–15.0)
MCH: 23 pg — ABNORMAL LOW (ref 26.0–34.0)
MCHC: 30 g/dL (ref 30.0–36.0)
MCV: 76.7 fL — ABNORMAL LOW (ref 78.0–100.0)
PLATELETS: 252 10*3/uL (ref 150–400)
RBC: 4.04 MIL/uL (ref 3.87–5.11)
RDW: 21.5 % — ABNORMAL HIGH (ref 11.5–15.5)
WBC: 5.7 10*3/uL (ref 4.0–10.5)

## 2014-10-22 MED ORDER — ACETAMINOPHEN 500 MG PO TABS: 500.0000 mg | ORAL_TABLET | Freq: Once | ORAL | Status: DC

## 2014-10-22 MED ORDER — ENSURE ENLIVE PO LIQD: 237.0000 mL | Freq: Two times a day (BID) | ORAL | Status: DC

## 2014-10-22 NOTE — Progress Notes (Signed)
UR completed 

## 2014-10-22 NOTE — Discharge Summary (Signed)
Physician Discharge Summary  Lindsey Acevedo:[id number] DOB: February 02, 1996 DOA: 10/20/2014  PCP: Saralyn PilarKaramalegos, Alexander, DO  Admit date: 10/20/2014 Discharge date: 10/22/2014  Time spent: 35 minutes  Recommendations for Outpatient Follow-up:  Needs referral to hematologist for IV iron and further evaluation of anemia.   Discharge Diagnoses:    Iron deficiency anemia   Dizziness   Discharge Condition: stable.   Diet recommendation: regular diet.   Filed Weights   10/20/14 1917 10/20/14 2230  Weight: 53.978 kg (119 lb) 52.708 kg (116 lb 3.2 oz)    History of present illness:  Lindsey RabonSanobria Emma is a 19 y.o. female history of chronic iron deficiency anemia and has had previous extensive workup including colonoscopy and had [place] up with hematologist in [place] presents to the ER because of dizziness and weakness. Patient states that over the last 2-3 days patient has been feeling weak and dizzy and also had some blurred vision today. 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 Patient denies any chest pain shortness of breath headache nausea vomiting diarrhea focal deficits. In the ER patient is found to be orthostatic. Patient's hemoglobin is around 7.3 at admission and the previous one was around 8. Patient has been taking iron pills despite which patient's hemoglobin has not improved. Patient denies any menorrhagia or rectal bleeding.  Hospital Course:  1-Anemia, symptomatic; iron deficiency. Patient hb on admission at 7. She has received 2 units of PRBC. Hb this morning a t 9. She is feeling better, dizziness has resolved.  She will needs to follow up with hematology outpatient.  2-Dizziness; suspect secondary to anemia.  orthostatic vital negative. dizziness has resolved after 2 unit of PRBC.  3-migraine; complaining of headaches, has had similar pain before, neuro exam non focal. Pain better with tylenol.     Procedures:  none  Consultations:  none  Discharge Exam: Filed Vitals:   10/22/14 0525  BP: 103/57  Pulse: 68  Temp: 98.3 F (36.8 C)  Resp: 18    General: Alert in no distress.  Cardiovascular: S 1, S 2 RRR Respiratory: CTA  Discharge Instructions   Discharge Instructions    Diet general    Complete by:  As directed      Increase activity slowly    Complete by:  As directed           Current Discharge Medication List    START taking these medications   Details  feeding supplement, ENSURE ENLIVE, (ENSURE ENLIVE) LIQD Take 237 mLs by mouth 2 (two) times daily between meals. Qty: 237 mL, Refills: 12      CONTINUE these medications which have NOT CHANGED   Details  acetaminophen (TYLENOL) 325 MG tablet Take 650 mg by mouth every 6 (six) hours as needed for mild pain or fever (pain).     ferrous sulfate 325 (65 FE) MG tablet Take 1 tablet (325 mg total) by mouth 3 (three) times daily. Qty: 30 tablet, Refills: 0      STOP taking these medications     ciprofloxacin (CIPRO) 250 MG tablet      HYDROcodone-acetaminophen (NORCO/VICODIN) 5-325 MG per tablet      megestrol (MEGACE) 40 MG tablet        No Known Allergies Follow-up Information    Follow up with Saralyn PilarKaramalegos, Alexander, DO In 1 week.   Specialty:  Osteopathic Medicine   Contact information:   [address] [place] [address] [phone]  The results of significant diagnostics from this hospitalization (including imaging, microbiology, ancillary and laboratory) are listed below for reference.    Significant Diagnostic Studies: No results found.  Microbiology: No results found for this or any previous visit (from the past 240 hour(s)).   Labs: Basic Metabolic Panel:  Recent Labs Lab 10/20/14 1928 10/21/14 0535  NA 138 136  K 4.0 3.9  CL 108 109  CO2 25 22  GLUCOSE 88 87  BUN 8 11  CREATININE 0.55 0.51  CALCIUM 8.8 8.5   Liver Function Tests:  Recent  Labs Lab 10/21/14 0535  AST 22  ALT 19  ALKPHOS 62  BILITOT 0.7  PROT 6.4  ALBUMIN 3.9   No results for input(s): LIPASE, AMYLASE in the last 168 hours. No results for input(s): AMMONIA in the last 168 hours. CBC:  Recent Labs Lab 10/20/14 1928 10/21/14 0535 10/21/14 2305 10/22/14 0520  WBC 4.4 4.8  --  5.7  NEUTROABS  --  1.8  --   --   HGB 7.3* 8.1* 9.4* 9.3*  HCT 25.9* 27.4* 31.0* 31.0*  MCV 73.8* 76.1*  --  76.7*  PLT 275 251  --  252   Cardiac Enzymes: No results for input(s): CKTOTAL, CKMB, CKMBINDEX, TROPONINI in the last 168 hours. BNP: BNP (last 3 results) No results for input(s): BNP in the last 8760 hours.  ProBNP (last 3 results) No results for input(s): PROBNP in the last 8760 hours.  CBG: No results for input(s): GLUCAP in the last 168 hours.     Signed:  Hartley Barefoot A  [facility] 10/22/2014, 10:12 AM

## 2015-03-02 HISTORY — PX: LUMBAR PUNCTURE: SHX1985

## 2015-03-26 ENCOUNTER — Inpatient Hospital Stay (HOSPITAL_COMMUNITY)
Admission: AD | Admit: 2015-03-26 | Discharge: 2015-03-26 | Disposition: A | Discharge status: Home / Self Care | Payer: BLUE CROSS/BLUE SHIELD | Source: Ambulatory Visit | Attending: Obstetrics & Gynecology | Admitting: Obstetrics & Gynecology

## 2015-03-26 ENCOUNTER — Encounter (HOSPITAL_COMMUNITY): Payer: Self-pay | Admitting: *Deleted

## 2015-03-26 DIAGNOSIS — N921 Excessive and frequent menstruation with irregular cycle: Secondary | ICD-10-CM | POA: Diagnosis not present

## 2015-03-26 DIAGNOSIS — D509 Iron deficiency anemia, unspecified: Secondary | ICD-10-CM | POA: Insufficient documentation

## 2015-03-26 DIAGNOSIS — N939 Abnormal uterine and vaginal bleeding, unspecified: Secondary | ICD-10-CM | POA: Diagnosis not present

## 2015-03-26 LAB — CBC
HCT: 26 % — ABNORMAL LOW (ref 36.0–46.0)
Hemoglobin: 7.9 g/dL — ABNORMAL LOW (ref 12.0–15.0)
MCH: 23.5 pg — AB (ref 26.0–34.0)
MCHC: 30.4 g/dL (ref 30.0–36.0)
MCV: 77.4 fL — AB (ref 78.0–100.0)
Platelets: 237 10*3/uL (ref 150–400)
RBC: 3.36 MIL/uL — AB (ref 3.87–5.11)
RDW: 17.3 % — AB (ref 11.5–15.5)
WBC: 3.1 10*3/uL — ABNORMAL LOW (ref 4.0–10.5)

## 2015-03-26 LAB — WET PREP, GENITAL
Clue Cells Wet Prep HPF POC: NONE SEEN
Trich, Wet Prep: NONE SEEN
Yeast Wet Prep HPF POC: NONE SEEN

## 2015-03-26 LAB — POCT PREGNANCY, URINE: PREG TEST UR: NEGATIVE

## 2015-03-26 MED ORDER — PUREFE PLUS 106-1 MG PO CAPS: 1.0000 | ORAL_CAPSULE | Freq: Every day | ORAL | Status: AC

## 2015-03-26 MED ORDER — NORGESTIMATE-ETH ESTRADIOL 0.25-35 MG-MCG PO TABS: 1.0000 | ORAL_TABLET | Freq: Every day | ORAL | Status: AC

## 2015-03-26 NOTE — MAU Provider Note (Signed)
Chief Complaint: Vaginal Bleeding   First Provider Initiated Contact with Patient 03/26/15 1116      SUBJECTIVE HPI: Lindsey Acevedo is a 19 y.o. G0P0 who presents to maternity admissions reporting menses x 3 this month with hx of anemia.  She reports normal period on August 10, then again 1 week later, both moderate to heavy and lasting 4 days.  Then today she started bleeding again. She denies dizziness but does report an increase in fatigue and frequent h/a's in the last month.  She is sexually active and is not currently using birth control.  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 She has primary care in [place] but would like Gyn follow up in [place].  She denies vaginal itching/burning, urinary symptoms, dizziness, n/v, or fever/chills.     Vaginal Bleeding The patient's primary symptoms include vaginal bleeding. The patient's pertinent negatives include no pelvic pain or vaginal discharge. This is a recurrent problem. The current episode started today. The problem occurs intermittently. The problem has been unchanged. The pain is mild. The problem affects the left side. She is not pregnant. Associated symptoms include abdominal pain and headaches. Pertinent negatives include no back pain, chills, constipation, diarrhea, dysuria, fever, flank pain, frequency, nausea, urgency or vomiting. The vaginal bleeding is typical of menses. She has not been passing clots. She has not been passing tissue. Nothing aggravates the symptoms. She has tried nothing for the symptoms. She is sexually active. No, her partner does not have an STD. She uses nothing for contraception. Her menstrual history has been regular.    Past Medical History  Diagnosis Date  . Anemia     4 admissions for blood transfusions.  . Blood transfusion without reported diagnosis     s/p 4 blood transfusions since age 19.   Past Surgical History  Procedure Laterality Date  . Admissions      4 separate blood transfusions  for severe anemia requiring blood transfusions.  . Esophagogastroduodenoscopy  10/2012    negative; [place]; admission for severe anemia requiring blood transfusion.  . Colonoscopy  10/2012    negative; admission for severe anemia requiring blood transfusion.  . Lumbar puncture  03/02/15    was admitted for h/a and fever no known cause found   Social History   Social History  . Marital Status: Single    Spouse Name: N/A  . Number of Children: N/A  . Years of Education: N/A   Occupational History  . Not on file.   Social History Main Topics  . Smoking status: Never Smoker   . Smokeless tobacco: Never Used  . Alcohol Use: No  . Drug Use: No  . Sexual Activity: No   Other Topics Concern  . Not on file   Social History Narrative   Marital status: single; dating.      Children: none      Lives: in dorm at [employer]; lives with mother, father when not in school in [place].      Employment; full time Consulting civil engineer at [employer]; Printmaker.      Tobacco: none      Alcohol: none      Drugs: none      Sexually active: yes; condoms 100% of time.   No current facility-administered medications on file prior to encounter.   Current Outpatient Prescriptions on File Prior to Encounter  Medication Sig Dispense Refill  . ferrous sulfate 325 (65 FE) MG tablet Take 1 tablet (325 mg total) by mouth 3 (three)  times daily. 30 tablet 0   No Known Allergies  ROS:  Review of Systems  Constitutional: Negative for fever, chills and fatigue.  HENT: Negative for sinus pressure.   Eyes: Negative for photophobia.  Respiratory: Negative for shortness of breath.   Cardiovascular: Negative for chest pain.  Gastrointestinal: Positive for abdominal pain. Negative for nausea, vomiting, diarrhea and constipation.  Genitourinary: Positive for vaginal bleeding. Negative for dysuria, urgency, frequency, flank pain, vaginal discharge, difficulty urinating, vaginal pain and pelvic pain.  Musculoskeletal: Negative for back pain  and neck pain.  Neurological: Positive for headaches. Negative for dizziness and weakness.  Psychiatric/Behavioral: Negative.      I have reviewed patient's Past Medical Hx, Surgical Hx, Family Hx, Social Hx, medications and allergies.   Physical Exam   Patient Vitals for the past 24 hrs:  BP Temp Temp src Pulse Resp SpO2 Height Weight  03/26/15 0929 98/57 mmHg 98.4 F (36.9 C) Oral 92 18 100 % 5\' 5"  (1.651 m) 53.343 kg (117 lb 9.6 oz)   Constitutional: Well-developed, well-nourished female in no acute distress.  HEART: normal rate, heart sounds, regular rhythm RESP: normal effort, lung sounds clear and equal bilaterally GI: Abd soft, non-tender. Pos BS x 4 MS: Extremities nontender, no edema, normal ROM Neurologic: Alert and oriented x 4.  GU: Neg CVAT.  PELVIC EXAM: Cervix pink, visually closed, without lesion, small amount dark red bleeding, vaginal walls and external genitalia normal Bimanual exam: Cervix 0/long/high, firm, anterior, neg CMT, uterus nontender, nonenlarged, adnexa without tenderness, enlargement, or mass   LAB RESULTS Results for orders placed or performed during the hospital encounter of 03/26/15 (from the past 24 hour(s))  Pregnancy, urine POC     Status: None   Collection Time: 03/26/15  9:39 AM  Result Value Ref Range   Preg Test, Ur NEGATIVE NEGATIVE  CBC     Status: Abnormal   Collection Time: 03/26/15  9:55 AM  Result Value Ref Range   WBC 3.1 (L) 4.0 - 10.5 K/uL   RBC 3.36 (L) 3.87 - 5.11 MIL/uL   Hemoglobin 7.9 (L) 12.0 - 15.0 g/dL   HCT 81.1 (L) 91.4 - 78.2 %   MCV 77.4 (L) 78.0 - 100.0 fL   MCH 23.5 (L) 26.0 - 34.0 pg   MCHC 30.4 30.0 - 36.0 g/dL   RDW 95.6 (H) 21.3 - 08.6 %   Platelets 237 150 - 400 K/uL  Wet prep, genital     Status: Abnormal   Collection Time: 03/26/15 11:00 AM  Result Value Ref Range   Yeast Wet Prep HPF POC NONE SEEN NONE SEEN   Trich, Wet Prep NONE SEEN NONE SEEN   Clue Cells Wet Prep HPF POC NONE SEEN NONE SEEN    WBC, Wet Prep HPF POC FEW (A) NONE SEEN    --/--/A POS (03/29 2216)  IMAGING No results found.  MAU Management/MDM: Ordered labs and reviewed results.  Pt stable at time of discharge.  ASSESSMENT 1. Iron deficiency anemia   2. Abnormal uterine bleeding (AUB)   3. Metrorrhagia     PLAN Discharge home with bleeding precautions Start OCPs today Rx for daily iron replacement, Purefe Plus on pt formulary F/U in [facility]. Discussed LARCs as most effective birth control.  OCPs today but pt interested in IUD vs Nexplanon in clinic.    Follow-up Information    Follow up with [facility].   Specialty:  Obstetrics and Gynecology   Why:  The clinic will call  you with appointment.   Contact information:   [address] [place] [address] [phone]      Follow up with [facility] [place] [facility]ERN[facility] ADMISS[facility].   Why:  As needed for emergencies   Contact information:   [address] [id number] mc [place] [address] [phone]      Sharen Counter Certified Nurse-Midwife 03/26/2015  11:49 AM

## 2015-03-26 NOTE — MAU Note (Signed)
Urine in lab 

## 2015-03-26 NOTE — MAU Note (Signed)
Pt states 3 periods in the last 4 weeks, has hx of anemia in the past.  Was hospitalized recently with migraine HA & fever, has been taking ibuprofen on a regular basis.  Bleeding is light today.

## 2015-03-26 NOTE — Discharge Instructions (Signed)
Abnormal Uterine Bleeding Abnormal uterine bleeding can affect women at various stages in life, including teenagers, women in their reproductive years, pregnant women, and women who have reached menopause. Several kinds of uterine bleeding are considered abnormal, including:  Bleeding or spotting between periods.   Bleeding after sexual intercourse.   Bleeding that is heavier or more than normal.   Periods that last longer than usual.  Bleeding after menopause.  Many cases of abnormal uterine bleeding are minor and simple to treat, while others are more serious. Any type of abnormal bleeding should be evaluated by your health care provider. Treatment will depend on the cause of the bleeding. HOME CARE INSTRUCTIONS Monitor your condition for any changes. The following actions may help to alleviate any discomfort you are experiencing:  Avoid the use of tampons and douches as directed by your health care provider.  Change your pads frequently. You should get regular pelvic exams and Pap tests. Keep all follow-up appointments for diagnostic tests as directed by your health care provider.  SEEK MEDICAL CARE IF:   Your bleeding lasts more than 1 week.   You feel dizzy at times.  SEEK IMMEDIATE MEDICAL CARE IF:   You pass out.   You are changing pads every 15 to 30 minutes.   You have abdominal pain.  You have a fever.   You become sweaty or weak.   You are passing large blood clots from the vagina.   You start to feel nauseous and vomit. MAKE SURE YOU:   Understand these instructions.  Will watch your condition.  Will get help right away if you are not doing well or get worse. Document Released: 07/10/2005 Document Revised: 07/15/2013 Document Reviewed: 02/06/2013 [facility] Patient Information 2015 [place], [place]. This information is not intended to replace advice given to you by your health care provider. Make sure you discuss any questions you have with your  health care provider.  Iron Deficiency Anemia Anemia is a condition in which there are less red blood cells or hemoglobin in the blood than normal. Hemoglobin is the part of red blood cells that carries oxygen. Iron deficiency anemia is anemia caused by too little iron. It is the most common type of anemia. It may leave you tired and short of breath. CAUSES   Lack of iron in the diet.  Poor absorption of iron, as seen with intestinal disorders.  Intestinal bleeding.  Heavy periods. SIGNS AND SYMPTOMS  Mild anemia may not be noticeable. Symptoms may include:  Fatigue.  Headache.  Pale skin.  Weakness.  Tiredness.  Shortness of breath.  Dizziness.  Cold hands and feet.  Fast or irregular heartbeat. DIAGNOSIS  Diagnosis requires a thorough evaluation and physical exam by your health care provider. Blood tests are generally used to confirm iron deficiency anemia. Additional tests may be done to find the underlying cause of your anemia. These may include:  Testing for blood in the stool (fecal occult blood test).  A procedure to see inside the colon and rectum (colonoscopy).  A procedure to see inside the esophagus and stomach (endoscopy). TREATMENT  Iron deficiency anemia is treated by correcting the cause of the deficiency. Treatment may involve:  Adding iron-rich foods to your diet.  Taking iron supplements. Pregnant or breastfeeding women need to take extra iron because their normal diet usually does not provide the required amount.  Taking vitamins. Vitamin C improves the absorption of iron. Your health care provider may recommend that you take your iron tablets  with a glass of orange juice or vitamin C supplement. °· Medicines to make heavy menstrual flow lighter. °· Surgery. °HOME CARE INSTRUCTIONS  °· Take iron as directed by your health care provider. °¨ If you cannot tolerate taking iron supplements by mouth, talk to your health care provider about taking them  through a vein (intravenously) or an injection into a muscle. °¨ For the best iron absorption, iron supplements should be taken on an empty stomach. If you cannot tolerate them on an empty stomach, you may need to take them with food. °¨ Do not drink milk or take antacids at the same time as your iron supplements. Milk and antacids may interfere with the absorption of iron. °¨ Iron supplements can cause constipation. Make sure to include fiber in your diet to prevent constipation. A stool softener may also be recommended. °· Take vitamins as directed by your health care provider. °· Eat a diet rich in iron. Foods high in iron include liver, lean beef, whole-grain bread, eggs, dried fruit, and dark green leafy vegetables. °SEEK IMMEDIATE MEDICAL CARE IF:  °· You faint. If this happens, do not drive. Call your local emergency services (911 in [place] if no other help is available. °· You have chest pain. °· You feel nauseous or vomit. °· You have severe or increased shortness of breath with activity. °· You feel weak. °· You have a rapid heartbeat. °· You have unexplained sweating. °· You become light-headed when getting up from a chair or bed. °MAKE SURE YOU:  °· Understand these instructions. °· Will watch your condition. °· Will get help right away if you are not doing well or get worse. °Document Released: 07/07/2000 Document Revised: 07/15/2013 Document Reviewed: 03/17/2013 °[employer]® Patient Information ©2015 [employer]. This information is not intended to replace advice given to you by your health care provider. Make sure you discuss any questions you have with your health care provider. ° °

## 2015-03-26 NOTE — Progress Notes (Signed)
Spec exam and vag exam by CNM.  Cultures obtained

## 2015-03-30 LAB — GC/CHLAMYDIA PROBE AMP (~~LOC~~) NOT AT ARMC
CHLAMYDIA, DNA PROBE: NEGATIVE
Neisseria Gonorrhea: NEGATIVE

## 2015-04-08 ENCOUNTER — Emergency Department (HOSPITAL_COMMUNITY)
Admission: EM | Admit: 2015-04-08 | Discharge: 2015-04-08 | Disposition: A | Discharge status: Home / Self Care | Payer: BLUE CROSS/BLUE SHIELD | Attending: Emergency Medicine | Admitting: Emergency Medicine

## 2015-04-08 ENCOUNTER — Encounter (HOSPITAL_COMMUNITY): Payer: Self-pay | Admitting: *Deleted

## 2015-04-08 DIAGNOSIS — R5383 Other fatigue: Secondary | ICD-10-CM | POA: Diagnosis not present

## 2015-04-08 DIAGNOSIS — N939 Abnormal uterine and vaginal bleeding, unspecified: Secondary | ICD-10-CM | POA: Insufficient documentation

## 2015-04-08 DIAGNOSIS — Z3202 Encounter for pregnancy test, result negative: Secondary | ICD-10-CM | POA: Diagnosis not present

## 2015-04-08 DIAGNOSIS — D649 Anemia, unspecified: Secondary | ICD-10-CM | POA: Diagnosis not present

## 2015-04-08 LAB — I-STAT CHEM 8, ED
BUN: 13 mg/dL (ref 6–20)
CALCIUM ION: 1.15 mmol/L (ref 1.12–1.23)
Chloride: 106 mmol/L (ref 101–111)
Creatinine, Ser: 0.6 mg/dL (ref 0.44–1.00)
GLUCOSE: 77 mg/dL (ref 65–99)
HCT: 29 % — ABNORMAL LOW (ref 36.0–46.0)
HEMOGLOBIN: 9.9 g/dL — AB (ref 12.0–15.0)
Potassium: 4.1 mmol/L (ref 3.5–5.1)
Sodium: 137 mmol/L (ref 135–145)
TCO2: 19 mmol/L (ref 0–100)

## 2015-04-08 LAB — I-STAT BETA HCG BLOOD, ED (MC, WL, AP ONLY): I-stat hCG, quantitative: <5 m[IU]/mL (ref <5)

## 2015-04-08 NOTE — Discharge Instructions (Signed)
You were evaluated in the ED today for your fatigue and there does not appear to be an emergent cause her symptoms at this time. Your hemoglobin was 9.9 which is slightly above your baseline. It is important for you to follow-up with your doctors or OB/GYN for further evaluation and management of your symptoms. Return to ED for worsening symptoms.  Abnormal Uterine Bleeding Abnormal uterine bleeding can affect women at various stages in life, including teenagers, women in their reproductive years, pregnant women, and women who have reached menopause. Several kinds of uterine bleeding are considered abnormal, including:  Bleeding or spotting between periods.   Bleeding after sexual intercourse.   Bleeding that is heavier or more than normal.   Periods that last longer than usual.  Bleeding after menopause.  Many cases of abnormal uterine bleeding are minor and simple to treat, while others are more serious. Any type of abnormal bleeding should be evaluated by your health care provider. Treatment will depend on the cause of the bleeding. HOME CARE INSTRUCTIONS Monitor your condition for any changes. The following actions may help to alleviate any discomfort you are experiencing:  Avoid the use of tampons and douches as directed by your health care provider.  Change your pads frequently. You should get regular pelvic exams and Pap tests. Keep all follow-up appointments for diagnostic tests as directed by your health care provider.  SEEK MEDICAL CARE IF:   Your bleeding lasts more than 1 week.   You feel dizzy at times.  SEEK IMMEDIATE MEDICAL CARE IF:   You pass out.   You are changing pads every 15 to 30 minutes.   You have abdominal pain.  You have a fever.   You become sweaty or weak.   You are passing large blood clots from the vagina.   You start to feel nauseous and vomit. MAKE SURE YOU:   Understand these instructions.  Will watch your condition.  Will  get help right away if you are not doing well or get worse. Document Released: 07/10/2005 Document Revised: 07/15/2013 Document Reviewed: 02/06/2013 [facility] Patient Information 2015 [place], [place]. This information is not intended to replace advice given to you by your health care provider. Make sure you discuss any questions you have with your health care provider.

## 2015-04-08 NOTE — ED Notes (Addendum)
Pt presents via POV c/o fatigue.  Pt reports having 4 normal menstrual periods in 1 month, pt started birth control 2 weeks ago, pt is anemic and concerned her Hmg is low.  Pt a x 4, NAD.  Pt also reports lower RQ abdominal pain x 1 week, some nausea, taking Reglan for it.

## 2015-04-08 NOTE — ED Provider Notes (Signed)
CSN: [id number]     Arrival date & time 04/08/15  1050 History   First MD Initiated Contact with Patient 04/08/15 1057     Chief Complaint  Patient presents with  . Vaginal Bleeding  . Fatigue  . Abdominal Pain     (Consider location/radiation/quality/duration/timing/severity/associated sxs/prior Treatment) HPI Lindsey Acevedo is a 19 y.o. female with a history of anemia, comes in for evaluation of fatigue. Patient states she has had an increased number of periods over the past month. She reports 4 periods this month lasting approximately 4 days. She is followed by OB/GYN for this problem, recently started on birth control for this problem. Today she is only concerned about her anemia and wanted to check her hemoglobin. She denies any other medical problems. No fevers, chills, chest pain or shortness of breath, dizziness or weakness, abdominal or pelvic pain.  Past Medical History  Diagnosis Date  . Anemia     4 admissions for blood transfusions.  . Blood transfusion without reported diagnosis     s/p 4 blood transfusions since age 26.   Past Surgical History  Procedure Laterality Date  . Admissions      4 separate blood transfusions for severe anemia requiring blood transfusions.  . Esophagogastroduodenoscopy  10/2012    negative; [place]; admission for severe anemia requiring blood transfusion.  . Colonoscopy  10/2012    negative; admission for severe anemia requiring blood transfusion.  . Lumbar puncture  03/02/15    was admitted for h/a and fever no known cause found   Family History  Problem Relation Age of Onset  . Colon cancer Neg Hx   . Hypertension Maternal Grandmother    Social History  Substance Use Topics  . Smoking status: Never Smoker   . Smokeless tobacco: Never Used  . Alcohol Use: No   OB History    No data available     Review of Systems A 10 point review of systems was completed and was negative except for pertinent positives and negatives as mentioned  in the history of present illness     Allergies  Review of patient's allergies indicates no known allergies.  Home Medications   Prior to Admission medications   Medication Sig Start Date End Date Taking? Authorizing Provider  butalbital-acetaminophen-caffeine (FIORICET WITH CODEINE) 50-325-40-30 MG per capsule Take 1 capsule by mouth every 4 (four) hours as needed for headache or migraine.    Historical Provider, MD  Fe Fum-FA-B Cmp-C-Zn-Mg-Mn-Cu (PUREFE PLUS) 106-1 MG CAPS Take 1 capsule by mouth daily. 03/26/15   Lisa A Leftwich-Kirby, CNM  ibuprofen (ADVIL,MOTRIN) 200 MG tablet Take 400 mg by mouth every 6 (six) hours as needed for fever.    Historical Provider, MD  norgestimate-ethinyl estradiol (ORTHO-CYCLEN,SPRINTEC,PREVIFEM) 0.25-35 MG-MCG tablet Take 1 tablet by mouth daily. 03/26/15   Misty Stanley A Leftwich-Kirby, CNM   BP 102/63 mmHg  Pulse 68  Temp(Src) 98.4 F (36.9 C) (Oral)  Ht 5\' 5"  (1.651 m)  Wt 113 lb (51.256 kg)  BMI 18.80 kg/m2  SpO2 100%  LMP 04/08/2015 Physical Exam  Constitutional: She is oriented to person, place, and time. She appears well-developed and well-nourished.  HENT:  Head: Normocephalic and atraumatic.  Mouth/Throat: Oropharynx is clear and moist.  Eyes: Pupils are equal, round, and reactive to light. Right eye exhibits no discharge. Left eye exhibits no discharge. No scleral icterus.  Conjunctiva slightly pale.  Neck: Neck supple.  Cardiovascular: Normal rate, regular rhythm and normal heart sounds.   Pulmonary/Chest: Effort  normal and breath sounds normal. No respiratory distress. She has no wheezes. She has no rales.  Abdominal: Soft. She exhibits no distension and no mass. There is no tenderness. There is no rebound and no guarding.  Musculoskeletal: Normal range of motion. She exhibits no edema or tenderness.  Neurological: She is alert and oriented to person, place, and time.  Cranial Nerves II-XII grossly intact. Motor and sensation appear to be  baseline for patient. Ambulates throughout the ED without difficulty.  Skin: Skin is warm and dry. No rash noted.  Psychiatric: She has a normal mood and affect.  Nursing note and vitals reviewed.   ED Course  Procedures (including critical care time) Labs Review Labs Reviewed  I-STAT CHEM 8, ED - Abnormal; Notable for the following:    Hemoglobin 9.9 (*)    HCT 29.0 (*)    All other components within normal limits  I-STAT BETA HCG BLOOD, ED ([facility], [facility], [facility] ONLY)    Imaging Review No results found. I have personally reviewed and evaluated these images and lab results as part of my medical decision-making.   EKG Interpretation None     Meds given in ED:  Medications - No data to display  New Prescriptions   No medications on file   Filed Vitals:   04/08/15 1145 04/08/15 1200 04/08/15 1215 04/08/15 1245  BP: 105/59 102/59 107/71 102/63  Pulse: 74 76 83 68  Temp:      TempSrc:      Height:      Weight:      SpO2: 100% 100% 100% 100%    MDM  Vitals stable - WNL -afebrile Pt resting comfortably in ED. PE-physical exam as above and is not concerning. Labwork-hemoglobin is a 9.9, which appears to be slightly better than her baseline. Pregnancy negative. No evidence of other acute or emergent pathology at this time. Patient is stable for discharge to follow-up with her PCP/OB/GYN for further evaluation and management of her ongoing medical problems. I discussed all relevant lab findings and imaging results with pt and they verbalized understanding. Discussed f/u with PCP within 48 hrs and return precautions, pt very amenable to plan.  Final diagnoses:  Vaginal bleeding  Other fatigue        Joycie Peek, PA-C 04/08/15 1256  Geoffery Lyons, MD 04/08/15 1415

## 2015-04-08 NOTE — ED Notes (Signed)
PA at bedside.

## 2015-04-14 ENCOUNTER — Encounter: Payer: BLUE CROSS/BLUE SHIELD | Admitting: Obstetrics and Gynecology

## 2015-04-19 ENCOUNTER — Ambulatory Visit (INDEPENDENT_AMBULATORY_CARE_PROVIDER_SITE_OTHER): Payer: BLUE CROSS/BLUE SHIELD | Admitting: Obstetrics & Gynecology

## 2015-04-19 ENCOUNTER — Encounter: Payer: Self-pay | Admitting: Obstetrics & Gynecology

## 2015-04-19 VITALS — BP 110/62 | HR 88 | Temp 98.9°F | Ht 65.0 in | Wt 118.8 lb

## 2015-04-19 DIAGNOSIS — N921 Excessive and frequent menstruation with irregular cycle: Secondary | ICD-10-CM | POA: Diagnosis not present

## 2015-04-19 DIAGNOSIS — Z3202 Encounter for pregnancy test, result negative: Secondary | ICD-10-CM | POA: Diagnosis not present

## 2015-04-19 DIAGNOSIS — Z30018 Encounter for initial prescription of other contraceptives: Secondary | ICD-10-CM | POA: Diagnosis not present

## 2015-04-19 DIAGNOSIS — Z30017 Encounter for initial prescription of implantable subdermal contraceptive: Secondary | ICD-10-CM

## 2015-04-19 LAB — POCT PREGNANCY, URINE: Preg Test, Ur: NEGATIVE

## 2015-04-19 MED ORDER — ETONOGESTREL 68 MG ~~LOC~~ IMPL
68.0000 mg | DRUG_IMPLANT | Freq: Once | SUBCUTANEOUS | Status: AC
  Administered 2015-04-19: 68 mg via SUBCUTANEOUS

## 2015-04-19 NOTE — Patient Instructions (Signed)
Etonogestrel implant What is this medicine? ETONOGESTREL (et oh noe JES trel) is a contraceptive (birth control) device. It is used to prevent pregnancy. It can be used for up to 3 years. This medicine may be used for other purposes; ask your health care provider or pharmacist if you have questions. COMMON BRAND NAME(S): Implanon, Nexplanon What should I tell my health care provider before I take this medicine? They need to know if you have any of these conditions: -abnormal vaginal bleeding -blood vessel disease or blood clots -cancer of the breast, cervix, or liver -depression -diabetes -gallbladder disease -headaches -heart disease or recent heart attack -high blood pressure -high cholesterol -kidney disease -liver disease -renal disease -seizures -tobacco smoker -an unusual or allergic reaction to etonogestrel, other hormones, anesthetics or antiseptics, medicines, foods, dyes, or preservatives -pregnant or trying to get pregnant -breast-feeding How should I use this medicine? This device is inserted just under the skin on the inner side of your upper arm by a health care professional. Talk to your pediatrician regarding the use of this medicine in children. Special care may be needed. Overdosage: If you think you've taken too much of this medicine contact a poison control center or emergency room at once. Overdosage: If you think you have taken too much of this medicine contact a poison control center or emergency room at once. NOTE: This medicine is only for you. Do not share this medicine with others. What if I miss a dose? This does not apply. What may interact with this medicine? Do not take this medicine with any of the following medications: -amprenavir -bosentan -fosamprenavir This medicine may also interact with the following medications: -barbiturate medicines for inducing sleep or treating seizures -certain medicines for fungal infections like ketoconazole and  itraconazole -griseofulvin -medicines to treat seizures like carbamazepine, felbamate, oxcarbazepine, phenytoin, topiramate -modafinil -phenylbutazone -rifampin -some medicines to treat HIV infection like atazanavir, indinavir, lopinavir, nelfinavir, tipranavir, ritonavir -St. John's wort This list may not describe all possible interactions. Give your health care provider a list of all the medicines, herbs, non-prescription drugs, or dietary supplements you use. Also tell them if you smoke, drink alcohol, or use illegal drugs. Some items may interact with your medicine. What should I watch for while using this medicine? This product does not protect you against HIV infection (AIDS) or other sexually transmitted diseases. You should be able to feel the implant by pressing your fingertips over the skin where it was inserted. Tell your doctor if you cannot feel the implant. What side effects may I notice from receiving this medicine? Side effects that you should report to your doctor or health care professional as soon as possible: -allergic reactions like skin rash, itching or hives, swelling of the face, lips, or tongue -breast lumps -changes in vision -confusion, trouble speaking or understanding -dark urine -depressed mood -general ill feeling or flu-like symptoms -light-colored stools -loss of appetite, nausea -right upper belly pain -severe headaches -severe pain, swelling, or tenderness in the abdomen -shortness of breath, chest pain, swelling in a leg -signs of pregnancy -sudden numbness or weakness of the face, arm or leg -trouble walking, dizziness, loss of balance or coordination -unusual vaginal bleeding, discharge -unusually weak or tired -yellowing of the eyes or skin Side effects that usually do not require medical attention (Report these to your doctor or health care professional if they continue or are bothersome.): -acne -breast pain -changes in  weight -cough -fever or chills -headache -irregular menstrual bleeding -itching, burning, and   vaginal discharge -pain or difficulty passing urine -sore throat This list may not describe all possible side effects. Call your doctor for medical advice about side effects. You may report side effects to FDA at 1-800-FDA-1088. Where should I keep my medicine? This drug is given in a hospital or clinic and will not be stored at home. NOTE: This sheet is a summary. It may not cover all possible information. If you have questions about this medicine, talk to your doctor, pharmacist, or health care provider.  2015, Elsevier/Gold Standard. (2012-01-15 15:37:45)  

## 2015-04-19 NOTE — Progress Notes (Signed)
Patient ID: Lindsey Acevedo, female   DOB: 07/07/1996, 19 y.o.   MRN: [id number]  Chief Complaint  Patient presents with  . Follow-up  h/o heavy bleeding and anemia, wants Nexplanon  HPI Lindsey Acevedo is a 19 y.o. female.  No obstetric history on file. Patient's last menstrual period was 04/07/2015. H/O menometrorrhagia and anemia  She requests Nexplanon insertion, has reviewed literature HPI  Past Medical History  Diagnosis Date  . Anemia     4 admissions for blood transfusions.  . Blood transfusion without reported diagnosis     s/p 4 blood transfusions since age 63.    Past Surgical History  Procedure Laterality Date  . Admissions      4 separate blood transfusions for severe anemia requiring blood transfusions.  . Esophagogastroduodenoscopy  10/2012    negative; [place]; admission for severe anemia requiring blood transfusion.  . Colonoscopy  10/2012    negative; admission for severe anemia requiring blood transfusion.  . Lumbar puncture  03/02/15    was admitted for h/a and fever no known cause found    Family History  Problem Relation Age of Onset  . Colon cancer Neg Hx   . Hypertension Maternal Grandmother     Social History Social History  Substance Use Topics  . Smoking status: Never Smoker   . Smokeless tobacco: Never Used  . Alcohol Use: No    No Known Allergies  Current Outpatient Prescriptions  Medication Sig Dispense Refill  . Fe Fum-FA-B Cmp-C-Zn-Mg-Mn-Cu (PUREFE PLUS) 106-1 MG CAPS Take 1 capsule by mouth daily. 30 capsule 5  . ibuprofen (ADVIL,MOTRIN) 200 MG tablet Take 400 mg by mouth every 6 (six) hours as needed for fever.    . norgestimate-ethinyl estradiol (ORTHO-CYCLEN,SPRINTEC,PREVIFEM) 0.25-35 MG-MCG tablet Take 1 tablet by mouth daily. 1 Package 11  . butalbital-acetaminophen-caffeine (FIORICET WITH CODEINE) 50-325-40-30 MG per capsule Take 1 capsule by mouth every 4 (four) hours as needed for headache or migraine.     Current  Facility-Administered Medications  Medication Dose Route Frequency Provider Last Rate Last Dose  . etonogestrel (NEXPLANON) implant 68 mg  68 mg Subdermal Once Adam Phenix, MD        Review of Systems Review of Systems  Constitutional: Negative.   Respiratory: Negative.   Gastrointestinal: Negative.   Genitourinary: Positive for menstrual problem. Negative for vaginal bleeding.    Blood pressure 110/62, pulse 88, temperature 98.9 F (37.2 C), height  (1.651 m), weight 118 lb 12.8 oz (53.887 kg), last menstrual period 04/07/2015.  Physical Exam Physical Exam  Constitutional: She appears well-developed.  Pulmonary/Chest: Effort normal.  Neurological: She is alert.  Skin: Skin is warm and dry.  Psychiatric: She has a normal mood and affect. Her behavior is normal.  Nexplanon insertion Patient given informed consent, signed copy in the chart, time out was performed. Pregnancy test was neg Appropriate time out taken.  Patient's left arm was prepped and draped in the usual sterile fashion.. The ruler used to measure and mark insertion area.  Pt was prepped with alcohol swab and then injected with 3 cc of 1 % lidocaine.  Pt was prepped with betadine, Nexplanon removed form packaging. The pip of the device was seen in the introducer. Then inserted per standard guidelines. Patient and provider were able to palpate rod under skin. Pt insertion site covered with sterile dressing.   Minimal blood loss.  Pt tolerated the procedure well.  Data Reviewed CBC    Component Value Date/Time  WBC 3.1* 03/26/2015 0955   WBC 2.9* 08/21/2013 1200   RBC 3.36* 03/26/2015 0955   RBC 2.97* 08/21/2013 1800   RBC 3.28* 08/21/2013 1200   HGB 9.9* 04/08/2015 1223   HGB 6.3* 08/21/2013 1200   HCT 29.0* 04/08/2015 1223   HCT 23.1* 08/21/2013 1200   PLT 237 03/26/2015 0955   MCV 77.4* 03/26/2015 0955   MCV 70.4* 08/21/2013 1200   MCH 23.5* 03/26/2015 0955   MCH 19.2* 08/21/2013 1200   MCHC 30.4  03/26/2015 0955   MCHC 27.3* 08/21/2013 1200   RDW 17.3* 03/26/2015 0955   LYMPHSABS 2.2 10/21/2014 0535   MONOABS 0.7 10/21/2014 0535   EOSABS 0.1 10/21/2014 0535   BASOSABS 0.0 10/21/2014 0535      Assessment    H/O Dub anemia, requested Nexplanon Insertion done routinely     Plan    Report sx of pain swelling redness at insertion site. Instructions given        ARNOLD,JAMES 04/19/2015, 2:39 PM

## 2015-08-16 ENCOUNTER — Emergency Department (HOSPITAL_COMMUNITY)
Admission: EM | Admit: 2015-08-16 | Discharge: 2015-08-16 | Disposition: A | Discharge status: Home / Self Care | Payer: BLUE CROSS/BLUE SHIELD | Attending: Emergency Medicine | Admitting: Emergency Medicine

## 2015-08-16 ENCOUNTER — Encounter (HOSPITAL_COMMUNITY): Payer: Self-pay

## 2015-08-16 ENCOUNTER — Emergency Department (HOSPITAL_COMMUNITY): Payer: BLUE CROSS/BLUE SHIELD

## 2015-08-16 DIAGNOSIS — Z3202 Encounter for pregnancy test, result negative: Secondary | ICD-10-CM | POA: Diagnosis not present

## 2015-08-16 DIAGNOSIS — R002 Palpitations: Secondary | ICD-10-CM | POA: Diagnosis not present

## 2015-08-16 DIAGNOSIS — M549 Dorsalgia, unspecified: Secondary | ICD-10-CM | POA: Diagnosis not present

## 2015-08-16 DIAGNOSIS — Z79899 Other long term (current) drug therapy: Secondary | ICD-10-CM | POA: Insufficient documentation

## 2015-08-16 DIAGNOSIS — D509 Iron deficiency anemia, unspecified: Secondary | ICD-10-CM

## 2015-08-16 DIAGNOSIS — H538 Other visual disturbances: Secondary | ICD-10-CM | POA: Diagnosis not present

## 2015-08-16 DIAGNOSIS — R0602 Shortness of breath: Secondary | ICD-10-CM | POA: Diagnosis not present

## 2015-08-16 DIAGNOSIS — R531 Weakness: Secondary | ICD-10-CM | POA: Diagnosis present

## 2015-08-16 LAB — CBC WITH DIFFERENTIAL/PLATELET
BASOS ABS: 0 10*3/uL (ref 0.0–0.1)
Basophils Relative: 1 %
Eosinophils Absolute: 0 10*3/uL (ref 0.0–0.7)
Eosinophils Relative: 1 %
HCT: 31.8 % — ABNORMAL LOW (ref 36.0–46.0)
Hemoglobin: 9.4 g/dL — ABNORMAL LOW (ref 12.0–15.0)
LYMPHS ABS: 2 10*3/uL (ref 0.7–4.0)
Lymphocytes Relative: 48 %
MCH: 21.9 pg — ABNORMAL LOW (ref 26.0–34.0)
MCHC: 29.6 g/dL — AB (ref 30.0–36.0)
MCV: 74.1 fL — ABNORMAL LOW (ref 78.0–100.0)
MONO ABS: 0.5 10*3/uL (ref 0.1–1.0)
Monocytes Relative: 13 %
Neutro Abs: 1.4 10*3/uL — ABNORMAL LOW (ref 1.7–7.7)
Neutrophils Relative %: 37 %
Platelets: 296 10*3/uL (ref 150–400)
RBC: 4.29 MIL/uL (ref 3.87–5.11)
RDW: 18.6 % — AB (ref 11.5–15.5)
WBC: 3.9 10*3/uL — ABNORMAL LOW (ref 4.0–10.5)

## 2015-08-16 LAB — BASIC METABOLIC PANEL
Anion gap: 10 (ref 5–15)
BUN: 8 mg/dL (ref 6–20)
CALCIUM: 9.7 mg/dL (ref 8.9–10.3)
CO2: 22 mmol/L (ref 22–32)
CREATININE: 0.65 mg/dL (ref 0.44–1.00)
Chloride: 107 mmol/L (ref 101–111)
GFR calc non Af Amer: >60 mL/min (ref >60)
Glucose, Bld: 79 mg/dL (ref 65–99)
Potassium: 3.9 mmol/L (ref 3.5–5.1)
SODIUM: 139 mmol/L (ref 135–145)

## 2015-08-16 LAB — URINALYSIS, ROUTINE W REFLEX MICROSCOPIC
BILIRUBIN URINE: NEGATIVE
Glucose, UA: NEGATIVE mg/dL
KETONES UR: NEGATIVE mg/dL
Leukocytes, UA: NEGATIVE
NITRITE: NEGATIVE
Protein, ur: NEGATIVE mg/dL
Specific Gravity, Urine: 1.021 (ref 1.005–1.030)
pH: 5.5 (ref 5.0–8.0)

## 2015-08-16 LAB — URINE MICROSCOPIC-ADD ON: Bacteria, UA: NONE SEEN

## 2015-08-16 LAB — POC URINE PREG, ED: Preg Test, Ur: NEGATIVE

## 2015-08-16 NOTE — ED Notes (Signed)
Patient transported to X-ray 

## 2015-08-16 NOTE — Discharge Instructions (Signed)
Iron Deficiency Anemia, Adult Anemia is a condition in which there are less red blood cells or hemoglobin in the blood than normal. Hemoglobin is the part of red blood cells that carries oxygen. Iron deficiency anemia is anemia caused by too little iron. It is the most common type of anemia. It may leave you tired and short of breath. CAUSES   Lack of iron in the diet.  Poor absorption of iron, as seen with intestinal disorders.  Intestinal bleeding.  Heavy periods. SIGNS AND SYMPTOMS  Mild anemia may not be noticeable. Symptoms may include:  Fatigue.  Headache.  Pale skin.  Weakness.  Tiredness.  Shortness of breath.  Dizziness.  Cold hands and feet.  Fast or irregular heartbeat. DIAGNOSIS  Diagnosis requires a thorough evaluation and physical exam by your health care provider. Blood tests are generally used to confirm iron deficiency anemia. Additional tests may be done to find the underlying cause of your anemia. These may include:  Testing for blood in the stool (fecal occult blood test).  A procedure to see inside the colon and rectum (colonoscopy).  A procedure to see inside the esophagus and stomach (endoscopy). TREATMENT  Iron deficiency anemia is treated by correcting the cause of the deficiency. Treatment may involve:  Adding iron-rich foods to your diet.  Taking iron supplements. Pregnant or breastfeeding women need to take extra iron because their normal diet usually does not provide the required amount.  Taking vitamins. Vitamin C improves the absorption of iron. Your health care provider may recommend that you take your iron tablets with a glass of orange juice or vitamin C supplement.  Medicines to make heavy menstrual flow lighter.  Surgery. HOME CARE INSTRUCTIONS   Take iron as directed by your health care provider.  If you cannot tolerate taking iron supplements by mouth, talk to your health care provider about taking them through a vein  (intravenously) or an injection into a muscle.  For the best iron absorption, iron supplements should be taken on an empty stomach. If you cannot tolerate them on an empty stomach, you may need to take them with food.  Do not drink milk or take antacids at the same time as your iron supplements. Milk and antacids may interfere with the absorption of iron.  Iron supplements can cause constipation. Make sure to include fiber in your diet to prevent constipation. A stool softener may also be recommended.  Take vitamins as directed by your health care provider.  Eat a diet rich in iron. Foods high in iron include liver, lean beef, whole-grain bread, eggs, dried fruit, and dark green leafy vegetables. SEEK IMMEDIATE MEDICAL CARE IF:   You faint. If this happens, do not drive. Call your local emergency services (911 in [place] if no other help is available.  You have chest pain.  You feel nauseous or vomit.  You have severe or increased shortness of breath with activity.  You feel weak.  You have a rapid heartbeat.  You have unexplained sweating.  You become light-headed when getting up from a chair or bed. MAKE SURE YOU:   Understand these instructions.  Will watch your condition.  Will get help right away if you are not doing well or get worse.   This information is not intended to replace advice given to you by your health care provider. Make sure you discuss any questions you have with your health care provider.   Document Released: 07/07/2000 Document Revised: 07/31/2014 Document Reviewed: 03/17/2013 Elsevier  Interactive Patient Education 2016 Else[employer] Inc[employer]  Iron-Rich Diet Iron is a mineral that helps your body to produce hemoglobin. Hemoglobin is a protein in your red blood cells that carries oxygen to your body's tissues. Eating too little iron may cause you to feel weak and tired, and it can increase your risk for infection. Eating enough iron is necessary for your body's  metabolism, muscle function, and nervous system. Iron is naturally found in many foods. It can also be added to foods or fortified in foods. There are two types of dietary iron:  Heme iron. Heme iron is absorbed by the body more easily than nonheme iron. Heme iron is found in meat, poultry, and fish.  Nonheme iron. Nonheme iron is found in dietary supplements, iron-fortified grains, beans, and vegetables. You may need to follow an iron-rich diet if:  You have been diagnosed with iron deficiency or iron-deficiency anemia.  You have a condition that prevents you from absorbing dietary iron, such as:  Infection in your intestines.  Celiac disease. This involves long-lasting (chronic) inflammation of your intestines.  You do not eat enough iron.  You eat a diet that is high in foods that impair iron absorption.  You have lost a lot of blood.  You have heavy bleeding during your menstrual cycle.  You are pregnant. WHAT IS MY PLAN? Your health care provider may help you to determine how much iron you need per day based on your condition. Generally, when a person consumes sufficient amounts of iron in the diet, the following iron needs are met:  Men.  77-38 years old: 11 mg per day.  84-66 years old: 8 mg per day.  Women.   97-44 years old: 15 mg per day.  27-28 years old: 18 mg per day.  Over 66 years old: 8 mg per day.  Pregnant women: 27 mg per day.  Breastfeeding women: 9 mg per day. WHAT DO I NEED TO KNOW ABOUT AN IRON-RICH DIET?  Eat fresh fruits and vegetables that are high in vitamin C along with foods that are high in iron. This will help increase the amount of iron that your body absorbs from food, especially with foods containing nonheme iron. Foods that are high in vitamin C include oranges, peppers, tomatoes, and mango.  Take iron supplements only as directed by your health care provider. Overdose of iron can be life-threatening. If you were prescribed iron  supplements, take them with orange juice or a vitamin C supplement.  Cook foods in pots and pans that are made from iron.   Eat nonheme iron-containing foods alongside foods that are high in heme iron. This helps to improve your iron absorption.   Certain foods and drinks contain compounds that impair iron absorption. Avoid eating these foods in the same meal as iron-rich foods or with iron supplements. These include:  Coffee, black tea, and red wine.  Milk, dairy products, and foods that are high in calcium.  Beans, soybeans, and peas.  Whole grains.  When eating foods that contain both nonheme iron and compounds that impair iron absorption, follow these tips to absorb iron better.   Soak beans overnight before cooking.  Soak whole grains overnight and drain them before using.  Ferment flours before baking, such as using yeast in bread dough. WHAT FOODS CAN I EAT? Grains Iron-fortified breakfast cereal. Iron-fortified whole-wheat bread. Enriched rice. Sprouted grains. Vegetables Spinach. Potatoes with skin. Green peas. Broccoli. Red and green bell peppers. Fermented vegetables. Fruits Prunes. Raisins.  Oranges. Strawberries. Mango. Grapefruit. Meats and Other Protein Sources Beef liver. Oysters. Beef. Shrimp. [place]. Chicken. [place]. Sardines. Chickpeas. Nuts. Tofu. Beverages Tomato juice. Fresh orange juice. Prune juice. Hibiscus tea. Fortified instant breakfast shakes. Condiments Tahini. Fermented soy sauce. Sweets and Desserts Black-strap molasses.  Other Wheat germ. The items listed above may not be a complete list of recommended foods or beverages. Contact your dietitian for more options. WHAT FOODS ARE NOT RECOMMENDED? Grains Whole grains. Bran cereal. Bran flour. Oats. Vegetables Artichokes. Brussels sprouts. Kale. Fruits Blueberries. Raspberries. Strawberries. Figs. Meats and Other Protein Sources Soybeans. Products made from soy  protein. Dairy Milk. Cream. Cheese. Yogurt. Cottage cheese. Beverages Coffee. Black tea. Red wine. Sweets and Desserts Cocoa. Chocolate. Ice cream. Other Basil. Oregano. Parsley. The items listed above may not be a complete list of foods and beverages to avoid. Contact your dietitian for more information.   This information is not intended to replace advice given to you by your health care provider. Make sure you discuss any questions you have with your health care provider.   Document Released: 02/21/2005 Document Revised: 07/31/2014 Document Reviewed: 02/04/2014 Elsevier Interactive Patient Education [employer].

## 2015-08-16 NOTE — ED Provider Notes (Signed)
CSN: [id number]     Arrival date & time 08/16/15  1228 History  By signing my name below, I, Lindsey Acevedo, attest that this documentation has been prepared under the direction and in the presence of Cheri Fowler, PA-C. Electronically Signed: Angelene Giovanni, ED Scribe. 08/16/2015. 2:39 PM.      Chief Complaint  Patient presents with  . Weakness   The history is provided by the patient. No language interpreter was used.   HPI Comments: Lindsey Acevedo is a 20 y.o. female with a hx of anemia who presents to the Emergency Department complaining of fatigue and weakness onset one week ago. She reports associated 2 daily episodes of blurred vision with palpitations that last <30 minutes. She also reports intermittent SOB. She states that she had a fever of 100 several days ago which has resolved. She denies any recent falls or trauma. She denies any recent medication change. Her LNMP was approx. 2 days ago. She denies any chills, sore throat, cough, abdominal pain, CP, syncope, or leg swelling. She reports she has been taking her iron supplementation as prescribed.    Past Medical History  Diagnosis Date  . Anemia     4 admissions for blood transfusions.  . Blood transfusion without reported diagnosis     s/p 4 blood transfusions since age 89.   Past Surgical History  Procedure Laterality Date  . Admissions      4 separate blood transfusions for severe anemia requiring blood transfusions.  . Esophagogastroduodenoscopy  10/2012    negative; [place]; admission for severe anemia requiring blood transfusion.  . Colonoscopy  10/2012    negative; admission for severe anemia requiring blood transfusion.  . Lumbar puncture  03/02/15    was admitted for h/a and fever no known cause found   Family History  Problem Relation Age of Onset  . Colon cancer Neg Hx   . Hypertension Maternal Grandmother    Social History  Substance Use Topics  . Smoking status: Never Smoker   . Smokeless tobacco:  Never Used  . Alcohol Use: No   OB History    No data available     Review of Systems  Constitutional: Negative for fever and chills.  HENT: Negative for sore throat.   Eyes: Positive for visual disturbance.  Respiratory: Negative for cough.   Cardiovascular: Positive for palpitations. Negative for chest pain and leg swelling.  Gastrointestinal: Negative for abdominal pain.  Musculoskeletal: Positive for back pain.  All other systems reviewed and are negative.   Allergies  Review of patient's allergies indicates no known allergies.  Home Medications   Prior to Admission medications   Medication Sig Start Date End Date Taking? Authorizing Provider  butalbital-acetaminophen-caffeine (FIORICET WITH CODEINE) 50-325-40-30 MG per capsule Take 1 capsule by mouth every 4 (four) hours as needed for headache or migraine.    Historical Provider, MD  Fe Fum-FA-B Cmp-C-Zn-Mg-Mn-Cu (PUREFE PLUS) 106-1 MG CAPS Take 1 capsule by mouth daily. 03/26/15   Lisa A Leftwich-Kirby, CNM  ibuprofen (ADVIL,MOTRIN) 200 MG tablet Take 400 mg by mouth every 6 (six) hours as needed for fever.    Historical Provider, MD  norgestimate-ethinyl estradiol (ORTHO-CYCLEN,SPRINTEC,PREVIFEM) 0.25-35 MG-MCG tablet Take 1 tablet by mouth daily. 03/26/15   Wilmer Floor Leftwich-Kirby, CNM   BP 117/77 mmHg  Pulse 93  Temp(Src) 98.5 F (36.9 C) (Oral)  Resp 18  Ht  (1.651 m)  Wt 53.071 kg  BMI 19.47 kg/m2  SpO2 100%  LMP 08/12/2015 Physical  Exam  Constitutional: She is oriented to person, place, and time. She appears well-developed and well-nourished.  Non-toxic appearance. She does not have a sickly appearance. She does not appear ill.  HENT:  Head: Normocephalic and atraumatic.  Mouth/Throat: Oropharynx is clear and moist.  Eyes: Conjunctivae are normal. Pupils are equal, round, and reactive to light.  Neck: Normal range of motion. Neck supple.  Cardiovascular: Normal rate, regular rhythm and normal heart sounds.    No murmur heard. Pulmonary/Chest: Effort normal and breath sounds normal. No accessory muscle usage or stridor. No respiratory distress. She has no wheezes. She has no rhonchi. She has no rales.  Abdominal: Soft. Bowel sounds are normal. She exhibits no distension. There is no tenderness.  Musculoskeletal: Normal range of motion.  Lymphadenopathy:    She has no cervical adenopathy.  Neurological: She is alert and oriented to person, place, and time.  Speech clear without dysarthria.  Strength and sensation intact bilaterally throughout upper and lower extremities.  Gait normal.   Skin: Skin is warm and dry.  Psychiatric: She has a normal mood and affect. Her behavior is normal.    ED Course  Procedures (including critical care time) DIAGNOSTIC STUDIES: Oxygen Saturation is 100% on RA, normal by my interpretation.    COORDINATION OF CARE: 2:36 PM- Pt advised of plan for treatment and pt agrees. Will consult with Attending.    Labs Review Labs Reviewed  CBC WITH DIFFERENTIAL/PLATELET - Abnormal; Notable for the following:    WBC 3.9 (*)    Hemoglobin 9.4 (*)    HCT 31.8 (*)    MCV 74.1 (*)    MCH 21.9 (*)    MCHC 29.6 (*)    RDW 18.6 (*)    Neutro Abs 1.4 (*)    All other components within normal limits  URINALYSIS, ROUTINE W REFLEX MICROSCOPIC (NOT AT [facility]) - Abnormal; Notable for the following:    APPearance CLOUDY (*)    Hgb urine dipstick SMALL (*)    All other components within normal limits  URINE MICROSCOPIC-ADD ON - Abnormal; Notable for the following:    Squamous Epithelial / LPF 6-30 (*)    All other components within normal limits  BASIC METABOLIC PANEL  POC URINE PREG, ED    Imaging Review No results found.   Cheri Fowler, PA-C has personally reviewed and evaluated these images and lab results as part of her medical decision-making.   ED ECG REPORT   Date: 08/16/2015  Rate: 76  Rhythm: NSR with sinus arrhythmia  QRS Axis: normal  Intervals: normal   ST/T Wave abnormalities: normal  Conduction Disutrbances:none  Narrative Interpretation:   Old EKG Reviewed: unchanged  I have personally reviewed the EKG tracing and agree with the computerized printout as noted.   I have personally reviewed the EKG tracing and agree with the computerized printout as noted.  MDM   Final diagnoses:  Iron deficiency anemia    Patient presents with one-week history of fatigue. After chart review she has extensive history of abnormal uterine bleeding and anemia which at times has required blood transfusion. He reports she is taking her iron pills as prescribed. VSS, NAD.  On exam, heart RRR, lungs CTAB, abdomen soft and benign.  Labs remarkable for a BBC of 3.9 and hemoglobin of 9.4. RBC morphology remarkable for polychromasia and vacuolated neutrophils on WBC morphology. Otherwise labs unremarkable.  CXR unremarkable.  EKG shows NSR with sinus arrhythmia.  I suspect her symptoms are related to her  hgb of 9.4 and hx of abnormal uterine bleeding.  Advised patient to continue taking iron supplements.  No indication for blood transfusion at this time.  Patient advised to follow up with PCP.  Discussed return precautions.  Patient agrees and acknowledges the above plan for discharge.  Case has been discussed with Dr. Rosalia Hammers who agrees with the above plan for discharge.    I personally performed the services described in this documentation, which was scribed in my presence. The recorded information has been reviewed and is accurate.   Cheri Fowler, PA-C 08/16/15 1536  Margarita Grizzle, MD 08/16/15 [phone]

## 2015-08-16 NOTE — ED Notes (Signed)
Patient here with complaint of weakness and fatigue x 1 week, reports hx of anemia. No other associated symptoms

## 2015-09-12 ENCOUNTER — Emergency Department (HOSPITAL_COMMUNITY): Payer: BLUE CROSS/BLUE SHIELD

## 2015-09-12 ENCOUNTER — Emergency Department (HOSPITAL_COMMUNITY)
Admission: EM | Admit: 2015-09-12 | Discharge: 2015-09-12 | Disposition: A | Discharge status: Home / Self Care | Payer: BLUE CROSS/BLUE SHIELD | Attending: Emergency Medicine | Admitting: Emergency Medicine

## 2015-09-12 ENCOUNTER — Encounter (HOSPITAL_COMMUNITY): Payer: Self-pay | Admitting: Emergency Medicine

## 2015-09-12 DIAGNOSIS — D649 Anemia, unspecified: Secondary | ICD-10-CM | POA: Diagnosis not present

## 2015-09-12 DIAGNOSIS — R5383 Other fatigue: Secondary | ICD-10-CM | POA: Diagnosis not present

## 2015-09-12 DIAGNOSIS — R42 Dizziness and giddiness: Secondary | ICD-10-CM | POA: Diagnosis not present

## 2015-09-12 DIAGNOSIS — Z793 Long term (current) use of hormonal contraceptives: Secondary | ICD-10-CM | POA: Insufficient documentation

## 2015-09-12 DIAGNOSIS — R112 Nausea with vomiting, unspecified: Secondary | ICD-10-CM | POA: Insufficient documentation

## 2015-09-12 DIAGNOSIS — Z79899 Other long term (current) drug therapy: Secondary | ICD-10-CM | POA: Insufficient documentation

## 2015-09-12 DIAGNOSIS — F419 Anxiety disorder, unspecified: Secondary | ICD-10-CM | POA: Insufficient documentation

## 2015-09-12 LAB — CBC
HCT: 29.5 % — ABNORMAL LOW (ref 36.0–46.0)
HEMOGLOBIN: 9.1 g/dL — AB (ref 12.0–15.0)
MCH: 22.9 pg — ABNORMAL LOW (ref 26.0–34.0)
MCHC: 30.8 g/dL (ref 30.0–36.0)
MCV: 74.3 fL — ABNORMAL LOW (ref 78.0–100.0)
Platelets: 298 10*3/uL (ref 150–400)
RBC: 3.97 MIL/uL (ref 3.87–5.11)
RDW: 18.1 % — ABNORMAL HIGH (ref 11.5–15.5)
WBC: 6.5 10*3/uL (ref 4.0–10.5)

## 2015-09-12 LAB — I-STAT BETA HCG BLOOD, ED (MC, WL, AP ONLY)

## 2015-09-12 LAB — COMPREHENSIVE METABOLIC PANEL
ALBUMIN: 4.9 g/dL (ref 3.5–5.0)
ALK PHOS: 80 U/L (ref 38–126)
ALT: 52 U/L (ref 14–54)
ANION GAP: 11 (ref 5–15)
AST: 35 U/L (ref 15–41)
BILIRUBIN TOTAL: 0.7 mg/dL (ref 0.3–1.2)
BUN: 7 mg/dL (ref 6–20)
CALCIUM: 9.6 mg/dL (ref 8.9–10.3)
CO2: 20 mmol/L — ABNORMAL LOW (ref 22–32)
CREATININE: 0.51 mg/dL (ref 0.44–1.00)
Chloride: 108 mmol/L (ref 101–111)
GFR calc Af Amer: >60 mL/min (ref >60)
GFR calc non Af Amer: >60 mL/min (ref >60)
GLUCOSE: 98 mg/dL (ref 65–99)
Potassium: 3.6 mmol/L (ref 3.5–5.1)
Sodium: 139 mmol/L (ref 135–145)
TOTAL PROTEIN: 8.1 g/dL (ref 6.5–8.1)

## 2015-09-12 LAB — LIPASE, BLOOD: Lipase: 19 U/L (ref 11–51)

## 2015-09-12 MED ORDER — ONDANSETRON HCL 4 MG PO TABS: 4.0000 mg | ORAL_TABLET | Freq: Four times a day (QID) | ORAL | Status: AC

## 2015-09-12 MED ORDER — SODIUM CHLORIDE 0.9 % IV BOLUS (SEPSIS)
1000.0000 mL | Freq: Once | INTRAVENOUS | Status: AC
  Administered 2015-09-12: 1000 mL via INTRAVENOUS

## 2015-09-12 MED ORDER — ACETAMINOPHEN 500 MG PO TABS
1000.0000 mg | ORAL_TABLET | Freq: Once | ORAL | Status: AC
  Administered 2015-09-12: 1000 mg via ORAL
  Filled 2015-09-12: qty 2

## 2015-09-12 MED ORDER — ONDANSETRON HCL 4 MG/2ML IJ SOLN
4.0000 mg | Freq: Once | INTRAMUSCULAR | Status: AC
  Administered 2015-09-12: 4 mg via INTRAVENOUS
  Filled 2015-09-12: qty 2

## 2015-09-12 MED ORDER — ONDANSETRON 4 MG PO TBDP
4.0000 mg | ORAL_TABLET | Freq: Once | ORAL | Status: AC | PRN
  Administered 2015-09-12: 4 mg via ORAL
  Filled 2015-09-12: qty 1

## 2015-09-12 NOTE — ED Provider Notes (Signed)
CSN: [id number]     Arrival date & time 09/12/15  1550 History   First MD Initiated Contact with Patient 09/12/15 2102     Chief Complaint  Patient presents with  . Emesis     (Consider location/radiation/quality/duration/timing/severity/associated sxs/prior Treatment) HPI Comments: Patient presents to the emergency department with chief complaint of nausea and vomiting. She states that the symptoms started this morning. She denies any focal abdominal pain. States that she does have some abdominal cramping when she vomits. She denies any chest pain or shortness breath. She states that she has felt lightheaded and fatigued. There are no aggravating or alleviating factors. She has tried taking Zofran about 5 hours ago with minimal relief. She denies any dysuria.  The history is provided by the patient. No language interpreter was used.    Past Medical History  Diagnosis Date  . Anemia     4 admissions for blood transfusions.  . Blood transfusion without reported diagnosis     s/p 4 blood transfusions since age 45.   Past Surgical History  Procedure Laterality Date  . Admissions      4 separate blood transfusions for severe anemia requiring blood transfusions.  . Esophagogastroduodenoscopy  10/2012    negative; [place]; admission for severe anemia requiring blood transfusion.  . Colonoscopy  10/2012    negative; admission for severe anemia requiring blood transfusion.  . Lumbar puncture  03/02/15    was admitted for h/a and fever no known cause found   Family History  Problem Relation Age of Onset  . Colon cancer Neg Hx   . Hypertension Maternal Grandmother    Social History  Substance Use Topics  . Smoking status: Never Smoker   . Smokeless tobacco: Never Used  . Alcohol Use: No   OB History    No data available     Review of Systems  Constitutional: Negative for fever and chills.  Respiratory: Negative for shortness of breath.   Cardiovascular: Negative for chest pain.   Gastrointestinal: Positive for nausea and vomiting. Negative for diarrhea and constipation.  Genitourinary: Negative for dysuria.  All other systems reviewed and are negative.     Allergies  Review of patient's allergies indicates no known allergies.  Home Medications   Prior to Admission medications   Medication Sig Start Date End Date Taking? Authorizing Provider  acetaminophen (TYLENOL) 325 MG tablet Take 650 mg by mouth every 6 (six) hours as needed for mild pain.   Yes Historical Provider, MD  etonogestrel (NEXPLANON) 68 MG IMPL implant 1 each by Subdermal route once.   Yes Historical Provider, MD  ferrous sulfate 325 (65 FE) MG tablet Take 325 mg by mouth 3 (three) times daily.   Yes Historical Provider, MD  Fe Fum-FA-B Cmp-C-Zn-Mg-Mn-Cu (PUREFE PLUS) 106-1 MG CAPS Take 1 capsule by mouth daily. Patient not taking: Reported on 08/16/2015 03/26/15   Wilmer Floor Leftwich-Kirby, CNM  norgestimate-ethinyl estradiol (ORTHO-CYCLEN,SPRINTEC,PREVIFEM) 0.25-35 MG-MCG tablet Take 1 tablet by mouth daily. Patient not taking: Reported on 08/16/2015 03/26/15   Misty Stanley A Leftwich-Kirby, CNM   BP 134/96 mmHg  Pulse 93  Temp(Src) 98.2 F (36.8 C) (Oral)  Resp 20  SpO2 100%  LMP 08/12/2015 Physical Exam  Constitutional: She is oriented to person, place, and time. She appears well-developed and well-nourished.  HENT:  Head: Normocephalic and atraumatic.  Eyes: Conjunctivae and EOM are normal. Pupils are equal, round, and reactive to light.  Neck: Normal range of motion. Neck supple.  Cardiovascular: Normal rate  and regular rhythm.  Exam reveals no gallop and no friction rub.   No murmur heard. Pulmonary/Chest: Effort normal and breath sounds normal. No respiratory distress. She has no wheezes. She has no rales. She exhibits no tenderness.  Abdominal: Soft. Bowel sounds are normal. She exhibits no distension and no mass. There is no tenderness. There is no rebound and no guarding.  No focal abdominal  tenderness, no RLQ tenderness or pain at McBurney's point, no RUQ tenderness or Murphy's sign, no left-sided abdominal tenderness, no fluid wave, or signs of peritonitis   Musculoskeletal: Normal range of motion. She exhibits no edema or tenderness.  Neurological: She is alert and oriented to person, place, and time.  Skin: Skin is warm and dry.  Psychiatric: She has a normal mood and affect. Her behavior is normal. Judgment and thought content normal.  Nursing note and vitals reviewed.   ED Course  Procedures (including critical care time) Results for orders placed or performed during the hospital encounter of 09/12/15  Lipase, blood  Result Value Ref Range   Lipase 19 11 - 51 U/L  Comprehensive metabolic panel  Result Value Ref Range   Sodium 139 135 - 145 mmol/L   Potassium 3.6 3.5 - 5.1 mmol/L   Chloride 108 101 - 111 mmol/L   CO2 20 (L) 22 - 32 mmol/L   Glucose, Bld 98 65 - 99 mg/dL   BUN 7 6 - 20 mg/dL   Creatinine, Ser 4.09 0.44 - 1.00 mg/dL   Calcium 9.6 8.9 - 81.1 mg/dL   Total Protein 8.1 6.5 - 8.1 g/dL   Albumin 4.9 3.5 - 5.0 g/dL   AST 35 15 - 41 U/L   ALT 52 14 - 54 U/L   Alkaline Phosphatase 80 38 - 126 U/L   Total Bilirubin 0.7 0.3 - 1.2 mg/dL   GFR calc non Af Amer >60 >60 mL/min   GFR calc Af Amer >60 >60 mL/min   Anion gap 11 5 - 15  CBC  Result Value Ref Range   WBC 6.5 4.0 - 10.5 K/uL   RBC 3.97 3.87 - 5.11 MIL/uL   Hemoglobin 9.1 (L) 12.0 - 15.0 g/dL   HCT 91.4 (L) 78.2 - 95.6 %   MCV 74.3 (L) 78.0 - 100.0 fL   MCH 22.9 (L) 26.0 - 34.0 pg   MCHC 30.8 30.0 - 36.0 g/dL   RDW 21.3 (H) 08.6 - 57.8 %   Platelets 298 150 - 400 K/uL  I-Stat beta hCG blood, ED ([facility], [facility], [facility] only)  Result Value Ref Range   I-stat hCG, quantitative <5.0 <5 mIU/mL   Comment 3           Dg Chest 2 View  09/12/2015  CLINICAL DATA:  Initial evaluation for aspiration. EXAM: CHEST  2 VIEW COMPARISON:  Prior study from 08/16/2015. FINDINGS: The cardiac and mediastinal silhouettes  are stable in size and contour, and remain within normal limits. The lungs are normally inflated. No airspace consolidation, pleural effusion, or pulmonary edema is identified. There is no pneumothorax. No acute osseous abnormality identified. IMPRESSION: No active cardiopulmonary disease. Electronically Signed   By: Rise Mu M.D.   On: 09/12/2015 21:33   Dg Chest 2 View  08/16/2015  CLINICAL DATA:  Shortness of breath for 1 week EXAM: CHEST  2 VIEW COMPARISON:  None. FINDINGS: The heart size and mediastinal contours are within normal limits. Both lungs are clear. The visualized skeletal structures are unremarkable. IMPRESSION: No active  cardiopulmonary disease. Electronically Signed   By: Charlett Nose M.D.   On: 08/16/2015 15:27    I have personally reviewed and evaluated these images and lab results as part of my medical decision-making.   MDM   Final diagnoses:  Non-intractable vomiting with nausea, vomiting of unspecified type    Patient with nausea and vomiting. She is anxious because she thinks she aspirated some, will check chest x-ray.  Vital signs have normalized as the patient has calmed down. Will give a liter of fluid because she states she feels weak and lightheaded. Abdomen is soft and nontender. Anticipate discharge to home.  Normal orthostatic VS.  Patient is quite anxious.  I think that her symptoms are being aggravated by her anxiety.  VSS.    Roxy Horseman, PA-C 09/12/15 2300  Dione Booze, MD 09/12/15 [phone]

## 2015-09-12 NOTE — ED Notes (Signed)
Pt states she woke up vomiting, has been vomiting throughout the morning. Pt states she choked on her vomit around 15:30 this afternoon, which made her feel dizzy and fall down. Presents extremely anxious, stating she feels like she can't breathe. Denies hx of anxiety. HR 130, resp 28 in triage. After instructing patient to try to slow breathing, she states she feels as though she's starting to be able to catch her breath.

## 2015-09-12 NOTE — Discharge Instructions (Signed)

## 2015-09-12 NOTE — ED Notes (Signed)
After instructing patient on deep breathing exercises and showing her how to slow her breathing, patient feels more calm. Still coughing occasionally. HR has decreased to 94, respirations have slowed to 20, pt states she still feels a little dizzy. Alerting ED MD to patient's condition.

## 2016-10-16 ENCOUNTER — Emergency Department (HOSPITAL_COMMUNITY): Payer: BLUE CROSS/BLUE SHIELD

## 2016-10-16 ENCOUNTER — Emergency Department (HOSPITAL_COMMUNITY)
Admission: EM | Admit: 2016-10-16 | Discharge: 2016-10-17 | Disposition: A | Discharge status: Home / Self Care | Payer: BLUE CROSS/BLUE SHIELD | Attending: Emergency Medicine | Admitting: Emergency Medicine

## 2016-10-16 ENCOUNTER — Other Ambulatory Visit: Payer: Self-pay

## 2016-10-16 ENCOUNTER — Encounter (HOSPITAL_COMMUNITY): Payer: Self-pay

## 2016-10-16 DIAGNOSIS — R079 Chest pain, unspecified: Secondary | ICD-10-CM | POA: Diagnosis present

## 2016-10-16 DIAGNOSIS — Z79899 Other long term (current) drug therapy: Secondary | ICD-10-CM | POA: Insufficient documentation

## 2016-10-16 DIAGNOSIS — Z5181 Encounter for therapeutic drug level monitoring: Secondary | ICD-10-CM | POA: Diagnosis not present

## 2016-10-16 DIAGNOSIS — R000 Tachycardia, unspecified: Secondary | ICD-10-CM | POA: Diagnosis not present

## 2016-10-16 LAB — URINALYSIS, ROUTINE W REFLEX MICROSCOPIC
Bilirubin Urine: NEGATIVE
GLUCOSE, UA: NEGATIVE mg/dL
HGB URINE DIPSTICK: NEGATIVE
Ketones, ur: 5 mg/dL — AB
LEUKOCYTES UA: NEGATIVE
Nitrite: NEGATIVE
Protein, ur: NEGATIVE mg/dL
Specific Gravity, Urine: 1.024 (ref 1.005–1.030)
pH: 5 (ref 5.0–8.0)

## 2016-10-16 LAB — BASIC METABOLIC PANEL
Anion gap: 11 (ref 5–15)
BUN: 9 mg/dL (ref 6–20)
CALCIUM: 9.5 mg/dL (ref 8.9–10.3)
CHLORIDE: 104 mmol/L (ref 101–111)
CO2: 23 mmol/L (ref 22–32)
CREATININE: 0.53 mg/dL (ref 0.44–1.00)
Glucose, Bld: 86 mg/dL (ref 65–99)
Potassium: 4 mmol/L (ref 3.5–5.1)
SODIUM: 138 mmol/L (ref 135–145)

## 2016-10-16 LAB — CBC
HCT: 30 % — ABNORMAL LOW (ref 36.0–46.0)
Hemoglobin: 8.5 g/dL — ABNORMAL LOW (ref 12.0–15.0)
MCH: 20.2 pg — ABNORMAL LOW (ref 26.0–34.0)
MCHC: 28.3 g/dL — ABNORMAL LOW (ref 30.0–36.0)
MCV: 71.3 fL — AB (ref 78.0–100.0)
PLATELETS: 372 10*3/uL (ref 150–400)
RBC: 4.21 MIL/uL (ref 3.87–5.11)
RDW: 18 % — AB (ref 11.5–15.5)
WBC: 10 10*3/uL (ref 4.0–10.5)

## 2016-10-16 LAB — RAPID URINE DRUG SCREEN, HOSP PERFORMED
AMPHETAMINES: NOT DETECTED
BENZODIAZEPINES: NOT DETECTED
Barbiturates: NOT DETECTED
Cocaine: NOT DETECTED
OPIATES: NOT DETECTED
TETRAHYDROCANNABINOL: NOT DETECTED

## 2016-10-16 LAB — D-DIMER, QUANTITATIVE: D-Dimer, Quant: <0.27 ug/mL-FEU (ref 0.00–0.50)

## 2016-10-16 LAB — I-STAT TROPONIN, ED
TROPONIN I, POC: 0 ng/mL (ref 0.00–0.08)
TROPONIN I, POC: 0 ng/mL (ref 0.00–0.08)

## 2016-10-16 LAB — MAGNESIUM: Magnesium: 1.9 mg/dL (ref 1.7–2.4)

## 2016-10-16 LAB — I-STAT CG4 LACTIC ACID, ED: Lactic Acid, Venous: 1.11 mmol/L (ref 0.5–1.9)

## 2016-10-16 LAB — TSH: TSH: 1.172 u[IU]/mL (ref 0.350–4.500)

## 2016-10-16 MED ORDER — GI COCKTAIL ~~LOC~~
30.0000 mL | Freq: Once | ORAL | Status: AC
  Administered 2016-10-16: 30 mL via ORAL
  Filled 2016-10-16: qty 30

## 2016-10-16 MED ORDER — SODIUM CHLORIDE 0.9 % IV BOLUS (SEPSIS)
1000.0000 mL | Freq: Once | INTRAVENOUS | Status: AC
  Administered 2016-10-16: 1000 mL via INTRAVENOUS

## 2016-10-16 MED ORDER — MORPHINE SULFATE (PF) 4 MG/ML IV SOLN
4.0000 mg | Freq: Once | INTRAVENOUS | Status: AC
  Administered 2016-10-16: 4 mg via INTRAVENOUS
  Filled 2016-10-16: qty 1

## 2016-10-16 MED ORDER — KETOROLAC TROMETHAMINE 15 MG/ML IJ SOLN
15.0000 mg | Freq: Once | INTRAMUSCULAR | Status: AC
  Administered 2016-10-16: 15 mg via INTRAVENOUS
  Filled 2016-10-16: qty 1

## 2016-10-16 MED ORDER — IOPAMIDOL (ISOVUE-370) INJECTION 76%
INTRAVENOUS | Status: AC
  Administered 2016-10-16: 100 mL
  Filled 2016-10-16: qty 100

## 2016-10-16 MED ORDER — LORAZEPAM 2 MG/ML IJ SOLN
0.5000 mg | Freq: Once | INTRAMUSCULAR | Status: AC
  Administered 2016-10-16: 0.5 mg via INTRAVENOUS
  Filled 2016-10-16: qty 1

## 2016-10-16 NOTE — ED Provider Notes (Addendum)
[facility] Provider Note   CSN: [id number] Arrival date & time: 10/16/16  1545  By signing my name below, I, Doreatha Martin, attest that this documentation has been prepared under the direction and in the presence of Shaune Pollack, MD. Electronically Signed: Doreatha Martin, ED Scribe. 10/16/16. 7:05 PM.    History   Chief Complaint No chief complaint on file.   HPI Lindsey Acevedo is a 21 y.o. female who presents to the Emergency Department complaining of intermittent palpitations, described as "racing", that began yesterday while watching TV. She states she is not currently experiencing palpitations. Pt also reports chest pressure/throbbing with lying down that began last night, lightheadedness that began today and dry cough for 2 days. Per pt, her chest pressure is only present when lying down, but the throbbing has now become constant. Pt states her chest throbbing is worsened with leaning forward. She [facility] and was referred here with a pulse of 140. Pt has Nexplanon. Pt does not consume caffeinated beverages, and cut sodas out of her diet for 3 weeks. Pt states she is eating and drinking normally. No h/o of similar symptoms, frequent seasonal allergies or cough, PE/DVT. No recent fractures, surgery, trauma, hospitalization or prolonged car/plane travel. She does not take any dietary or herbal supplements. No FHx of heart disease or PE/DVT, sudden cardiac death. Pt takes 325 mg tid iron d/t iron deficiency. Last transfusion was last year. Pt denies fever, nausea, vomiting, congestion, recent illnesses, sick contact, recent increased stress, weight changes, recent increased activity, sleep disturbance.   The history is provided by the patient. No language interpreter was used.    Past Medical History:  Diagnosis Date  . Anemia    4 admissions for blood transfusions.  . Blood transfusion without reported diagnosis    s/p 4 blood transfusions since age 62.      Patient Active Problem List   Diagnosis Date Noted  . Iron deficiency anemia 10/20/2014  . Dizziness 10/20/2014  . Abdominal pain, right upper quadrant 10/16/2013  . Menorrhagia 09/04/2013  . Anemia 08/21/2013    Past Surgical History:  Procedure Laterality Date  . Admissions     4 separate blood transfusions for severe anemia requiring blood transfusions.  . COLONOSCOPY  10/2012   negative; admission for severe anemia requiring blood transfusion.  . ESOPHAGOGASTRODUODENOSCOPY  10/2012   negative; [place]; admission for severe anemia requiring blood transfusion.  . LUMBAR PUNCTURE  03/02/15   was admitted for h/a and fever no known cause found    OB History    No data available       Home Medications    Prior to Admission medications   Medication Sig Start Date End Date Taking? Authorizing Provider  etonogestrel (NEXPLANON) 68 MG IMPL implant 1 each by Subdermal route once.   Yes Historical Provider, MD  ferrous sulfate 325 (65 FE) MG tablet Take 325 mg by mouth 3 (three) times daily.   Yes Historical Provider, MD  Fe Fum-FA-B Cmp-C-Zn-Mg-Mn-Cu (PUREFE PLUS) 106-1 MG CAPS Take 1 capsule by mouth daily. Patient not taking: Reported on 08/16/2015 03/26/15   Wilmer Floor Leftwich-Kirby, CNM  norgestimate-ethinyl estradiol (ORTHO-CYCLEN,SPRINTEC,PREVIFEM) 0.25-35 MG-MCG tablet Take 1 tablet by mouth daily. Patient not taking: Reported on 08/16/2015 03/26/15   Misty Stanley A Leftwich-Kirby, CNM  ondansetron (ZOFRAN) 4 MG tablet Take 1 tablet (4 mg total) by mouth every 6 (six) hours. Patient not taking: Reported on 10/17/2016 09/12/15   Roxy Horseman, PA-C  Family History Family History  Problem Relation Age of Onset  . Hypertension Maternal Grandmother   . Colon cancer Neg Hx     Social History Social History  Substance Use Topics  . Smoking status: Never Smoker  . Smokeless tobacco: Never Used  . Alcohol use No     Allergies   Patient has no known allergies.   Review of  Systems Review of Systems  Constitutional: Positive for fatigue. Negative for activity change, appetite change, chills, fever and unexpected weight change.  HENT: Negative for congestion, rhinorrhea and sore throat.   Eyes: Negative for visual disturbance.  Respiratory: Positive for cough. Negative for shortness of breath and wheezing.   Cardiovascular: Positive for chest pain (throbbing/pressure) and palpitations. Negative for leg swelling.  Gastrointestinal: Negative for abdominal pain, diarrhea, nausea and vomiting.  Genitourinary: Negative for dysuria, flank pain, vaginal bleeding and vaginal discharge.  Musculoskeletal: Negative for neck pain.  Skin: Negative for rash.  Allergic/Immunologic: Negative for immunocompromised state.  Neurological: Positive for light-headedness. Negative for syncope and headaches.  Hematological: Does not bruise/bleed easily.  Psychiatric/Behavioral: Negative for sleep disturbance.  All other systems reviewed and are negative.   Physical Exam Updated Vital Signs BP 110/69   Pulse (!) 103   Temp 98.7 F (37.1 C) (Oral)   Resp 15   Ht 5\' 5"  (1.651 m)   Wt 115 lb (52.2 kg)   LMP 08/18/2016 (Within Months)   SpO2 100%   BMI 19.14 kg/m   Physical Exam  Constitutional: She is oriented to person, place, and time. She appears well-developed and well-nourished. No distress.  HENT:  Head: Normocephalic and atraumatic.  Dry mucous membranes.   Eyes: Conjunctivae are normal.  Neck: Neck supple.  Cardiovascular: Regular rhythm, normal heart sounds and intact distal pulses.  Tachycardia present.  Exam reveals no friction rub.   No murmur heard. Tachycardic. Regular rhythm. DP pulses 2+ and equal.   Pulmonary/Chest: Effort normal and breath sounds normal. No respiratory distress. She has no wheezes. She has no rales.  Abdominal: She exhibits no distension.  Musculoskeletal: Normal range of motion. She exhibits no edema or tenderness.  No calf tenderness  or lower extremity edema.   Neurological: She is alert and oriented to person, place, and time. She exhibits normal muscle tone.  Skin: Skin is warm. Capillary refill takes less than 2 seconds.  Psychiatric: She has a normal mood and affect.  Nursing note and vitals reviewed.    ED Treatments / Results   DIAGNOSTIC STUDIES: Oxygen Saturation is 100% on RA, normal by my interpretation.    COORDINATION OF CARE: 7:05 PM Discussed treatment plan with pt at bedside which includes labs, CXR and pt agreed to plan.    Labs (all labs ordered are listed, but only abnormal results are displayed) Labs Reviewed  CBC - Abnormal; Notable for the following:       Result Value   Hemoglobin 8.5 (*)    HCT 30.0 (*)    MCV 71.3 (*)    MCH 20.2 (*)    MCHC 28.3 (*)    RDW 18.0 (*)    All other components within normal limits  URINALYSIS, ROUTINE W REFLEX MICROSCOPIC - Abnormal; Notable for the following:    Ketones, ur 5 (*)    All other components within normal limits  BASIC METABOLIC PANEL  D-DIMER, QUANTITATIVE (NOT AT [facility])  TSH  MAGNESIUM  RAPID URINE DRUG SCREEN, HOSP PERFORMED  PREGNANCY, URINE  I-STAT TROPOININ, ED  I-STAT BETA HCG BLOOD, ED ([facility], [facility], [facility] ONLY)  I-STAT CG4 LACTIC ACID, ED  I-STAT TROPOININ, ED    EKG  EKG Interpretation  Date/Time:  Monday October 16 2016 21:39:41 EDT Ventricular Rate:  136 PR Interval:  136 QRS Duration: 84 QT Interval:  288 QTC Calculation: 433 R Axis:   -17 Text Interpretation:  Sinus tachycardia Otherwise normal ECG Confirmed by Lincoln Brigham [phone]) on 10/16/2016 10:48:30 PM       Radiology Dg Chest 2 View  Result Date: 10/16/2016 CLINICAL DATA:  Pt c/o central chest pain, dizziness, and SOB x 1 day. No hx of heart or lung problems. Pt is a nonsmoker. EXAM: CHEST  2 VIEW COMPARISON:  09/12/2015 FINDINGS: Midline trachea.  Normal heart size and mediastinal contours. Sharp costophrenic angles.  No pneumothorax.  Clear lungs. IMPRESSION: No  active cardiopulmonary disease. Electronically Signed   By: Jeronimo Greaves M.D.   On: 10/16/2016 16:37   Ct Angio Chest Pe W Or Wo Contrast  Result Date: 10/17/2016 CLINICAL DATA:  Chest pain x2 days EXAM: CT ANGIOGRAPHY CHEST WITH CONTRAST TECHNIQUE: Multidetector CT imaging of the chest was performed using the standard protocol during bolus administration of intravenous contrast. Multiplanar CT image reconstructions and MIPs were obtained to evaluate the vascular anatomy. CONTRAST:  80 cc Isovue 370 IV COMPARISON:  None. FINDINGS: Cardiovascular: The study is of quality for the evaluation of pulmonary embolism. There are no filling defects in the central, lobar, segmental or subsegmental pulmonary artery branches to suggest acute pulmonary embolism. Great vessels are normal in course and caliber. Normal heart size. No significant pericardial fluid/thickening. Left vertebral artery directly takes off from the aortic arch. Mediastinum/Nodes: No discrete thyroid nodules. Unremarkable esophagus. No pathologically enlarged axillary, mediastinal or hilar lymph nodes. Lungs/Pleura: No pneumothorax. No pleural effusion. No pulmonary consolidation. Upper abdomen: Unremarkable. Musculoskeletal:  No aggressive appearing focal osseous lesions. Review of the MIP images confirms the above findings. IMPRESSION: No acute pulmonary embolus. No aortic aneurysm or dissection. No acute pulmonary disease. Electronically Signed   By: Tollie Eth M.D.   On: 10/17/2016 00:06    Procedures Procedures (including critical care time)  Medications Ordered in ED Medications  sodium chloride 0.9 % bolus 1,000 mL (0 mLs Intravenous Stopped 10/16/16 2203)  sodium chloride 0.9 % bolus 1,000 mL (0 mLs Intravenous Stopped 10/16/16 2203)  gi cocktail (Maalox,Lidocaine,Donnatal) (30 mLs Oral Given 10/16/16 2050)  ketorolac (TORADOL) 15 MG/ML injection 15 mg (15 mg Intravenous Given 10/16/16 2051)  LORazepam (ATIVAN) injection 0.5 mg (0.5 mg  Intravenous Given 10/16/16 2114)  morphine 4 MG/ML injection 4 mg (4 mg Intravenous Given 10/16/16 2253)  iopamidol (ISOVUE-370) 76 % injection (100 mLs  Contrast Given 10/16/16 2313)     Initial Impression / Assessment and Plan / ED Course  I have reviewed the triage vital signs and the nursing notes.  Pertinent labs & imaging results that were available during my care of the patient were reviewed by me and considered in my medical decision making (see chart for details).     21 yo F with no significant PMHx here with acute onset of palpitations, chest "pressure" and heaviness that is worse lying down and leaning forward, with no preceding sx. No leg swelling. On arrival, pt tachycardic but o/w well appearing, afebrile, and non-toxic. Exam unremarkable with exception of mild dehydration and tachycardia. Etiology unclear, DDx is broad and includes: dehydration, anxiety, hyperthyroidism, PE, primary arrhythmia. ACS less likely given age, absence of risk  factors, atypical nature of pain and neg troponin. Pericarditis also on ddx but she has no recent fever, chills, viral illnesses and EKG is w/o PR depression or ST elevations. Will give fluids, check orthostatics, and re-assess. Of note, tp anemic but this is baseline from long h/o chronic anemia; do not necessarily feel this is contributing to her tachycardia at this time.  Labs, imaging unremarkable. Hgb near baseline as mentioned. However, pt remains persistently tachycardic. On review of EKG, she does have new RSR' pattern. Discussed with Dr. Ronelle Nigh of Cardiology who reviewed EKG - no signs of arrhythmia, sinus tach with the new pattern in anterior leads. Although D-Dimer neg, in the setting of persistent tachycardia, subjective SOB, and new RSR', will check CT for PE, pericardial effusion, further assessment. Will also trial ativan. UA also noted to have ktonuria, c/w dehydration and will continue fluids. [facility] to follow-up results. Current  plan is to f/u response to ativan, UDS/UA, and CT. If sx resolve and HR <110 or at reasonable level, can d/c home. If she remains tachycardic, consider obs in hospital.  NOTE: Pt is not on any meds. No weight loss/nutritional supplements. No recent increased bleeding and she has been compliant with meds. No drug use. No alcohol use.  Final Clinical Impressions(s) / ED Diagnoses   Final diagnoses:  Tachycardia  Sinus tachycardia    I personally performed the services described in this documentation, which was scribed in my presence. The recorded information has been reviewed and is accurate.    Shaune Pollack, MD 10/17/16 1610    Shaune Pollack, MD 10/17/16 [phone]

## 2016-10-16 NOTE — ED Triage Notes (Signed)
Pt reports lightheadedness, dizziness and left sided throbbing chest pain with a pressure feeling to her chest when she lays down; ongoing for two days.

## 2016-10-16 NOTE — ED Notes (Signed)
Contacted CT.  CT sending transport for patient.

## 2016-10-16 NOTE — ED Notes (Signed)
On way to CT 

## 2016-10-17 MED ORDER — IBUPROFEN 600 MG PO TABS: 600.0000 mg | ORAL_TABLET | Freq: Four times a day (QID) | ORAL | 0 refills | Status: AC | PRN

## 2016-10-17 MED ORDER — HYDROCODONE-ACETAMINOPHEN 5-325 MG PO TABS: 1.0000 | ORAL_TABLET | Freq: Four times a day (QID) | ORAL | 0 refills | Status: AC | PRN

## 2016-10-17 NOTE — Discharge Instructions (Signed)
Please call the cardiologist listed tomorrow morning to schedule a follow up appointment.  Increase hydration.  Return to ER for return of symptoms, difficulty breathing, chest pain, new or worsening symptoms, any additional concerns.

## 2016-10-17 NOTE — ED Notes (Signed)
Patient Alert and oriented X4. Stable and ambulatory. Patient verbalized understanding of the discharge instructions.  Patient belongings were taken by the patient.  

## 2016-10-17 NOTE — ED Provider Notes (Signed)
Care assumed from previous provider Dr. Erma HeritageIsaacs. Please see note for further details. Briefly, patient is an otherwise healthy 21 y.o. who presented to the ED today for palpitations and chest "pressure". Lab work unremarkable including TSH, d-dimer, mag/elytes. Hgb near baseline and patient states that she is taking her iron supplementation as directed. Will follow up on CT angio, UDS, UA.   UDS negative. UA does not appear infectious.  CT angio negative.  Troponin negative x 2.   Patient evaluated and feels improved. HR in the 100-108 during my evaluation. She does have reproducible tenderness to the chest wall.   Given unclear etiology for sinus tachycardia, I discussed case with hospitalist, Dr. Katrinka BlazingSmith, for possible observation. He does not feel that patient warrants admission given heart rate is improving and negative thorough work up here in the ED. I also again discussed case with cardiology, Dr. Shirlee LatchMcLean, who does not feel that formal inpatient cardiology consult is warranted.   A significant amount of time was taken to discuss return precautions, specifically return of palpitations / chest pressure, shortness of breath or difficulty breathing, any new or worsening symptoms or any concerning symptoms. Cardiology outpatient follow up given. Also encouraged PCP follow up. All questions were answered and patient discharged to home in satisfactory condition.     [facility] Pilcher Keeshia Sanderlin, PA-C 10/17/16 0153    Shaune Pollackameron Isaacs, MD 10/19/16 [phone]

## 2016-10-20 ENCOUNTER — Encounter: Payer: Self-pay | Admitting: *Deleted

## 2016-11-06 ENCOUNTER — Ambulatory Visit: Payer: BLUE CROSS/BLUE SHIELD | Admitting: Cardiology

## 2016-11-08 ENCOUNTER — Encounter: Payer: Self-pay | Admitting: Cardiology

## 2017-06-29 ENCOUNTER — Ambulatory Visit: Payer: Self-pay | Admitting: Neurology

## 2017-07-06 ENCOUNTER — Encounter: Payer: Self-pay | Admitting: Neurology

## 2018-05-11 IMAGING — CR DG CHEST 2V
2 series · 2 of 2 positions shown · non-contrast
Comparison: 09/12/2015

CLINICAL DATA: Pt c/o central chest pain, dizziness, and SOB x 1
day. No hx of heart or lung problems. Pt is a nonsmoker.

EXAM:
CHEST  2 VIEW

[chest pa]
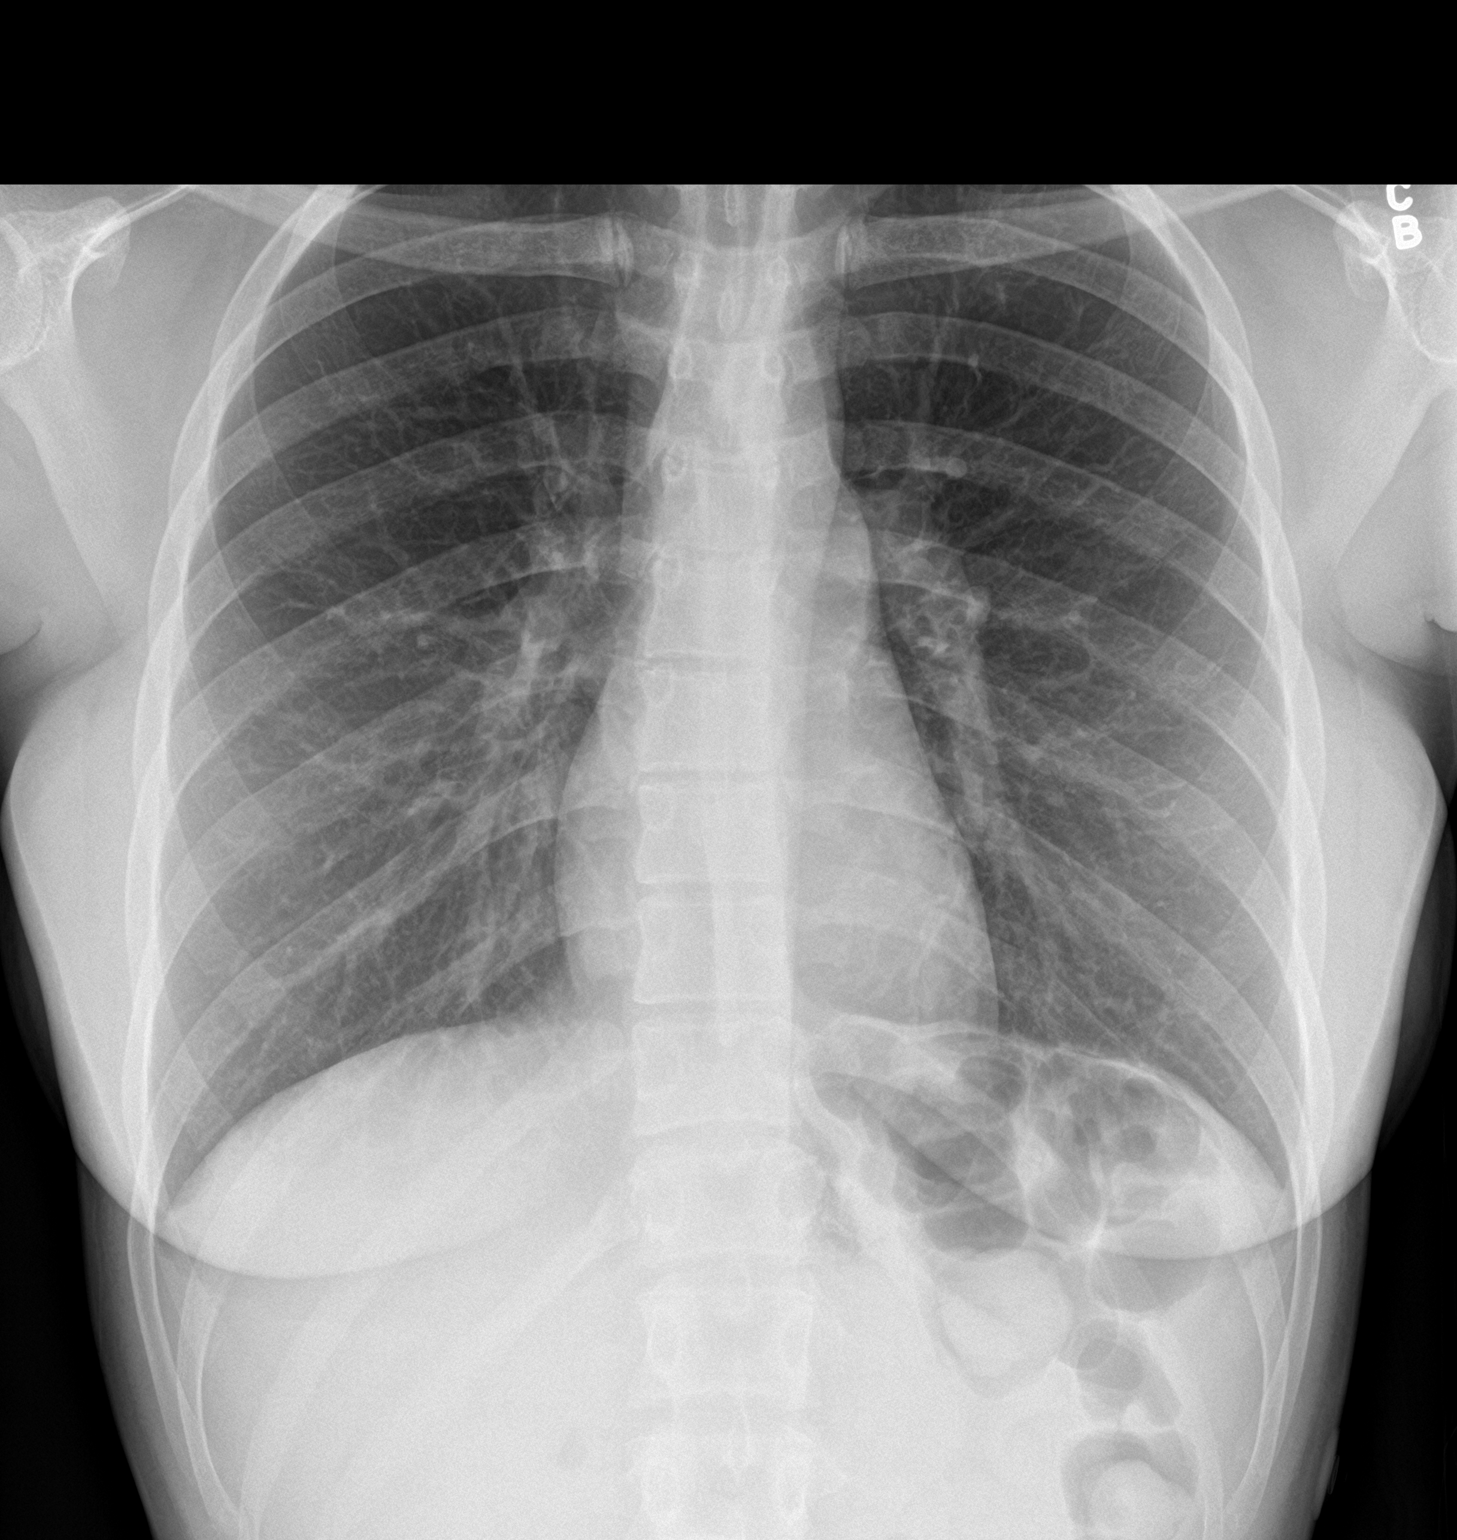

[chest lat]
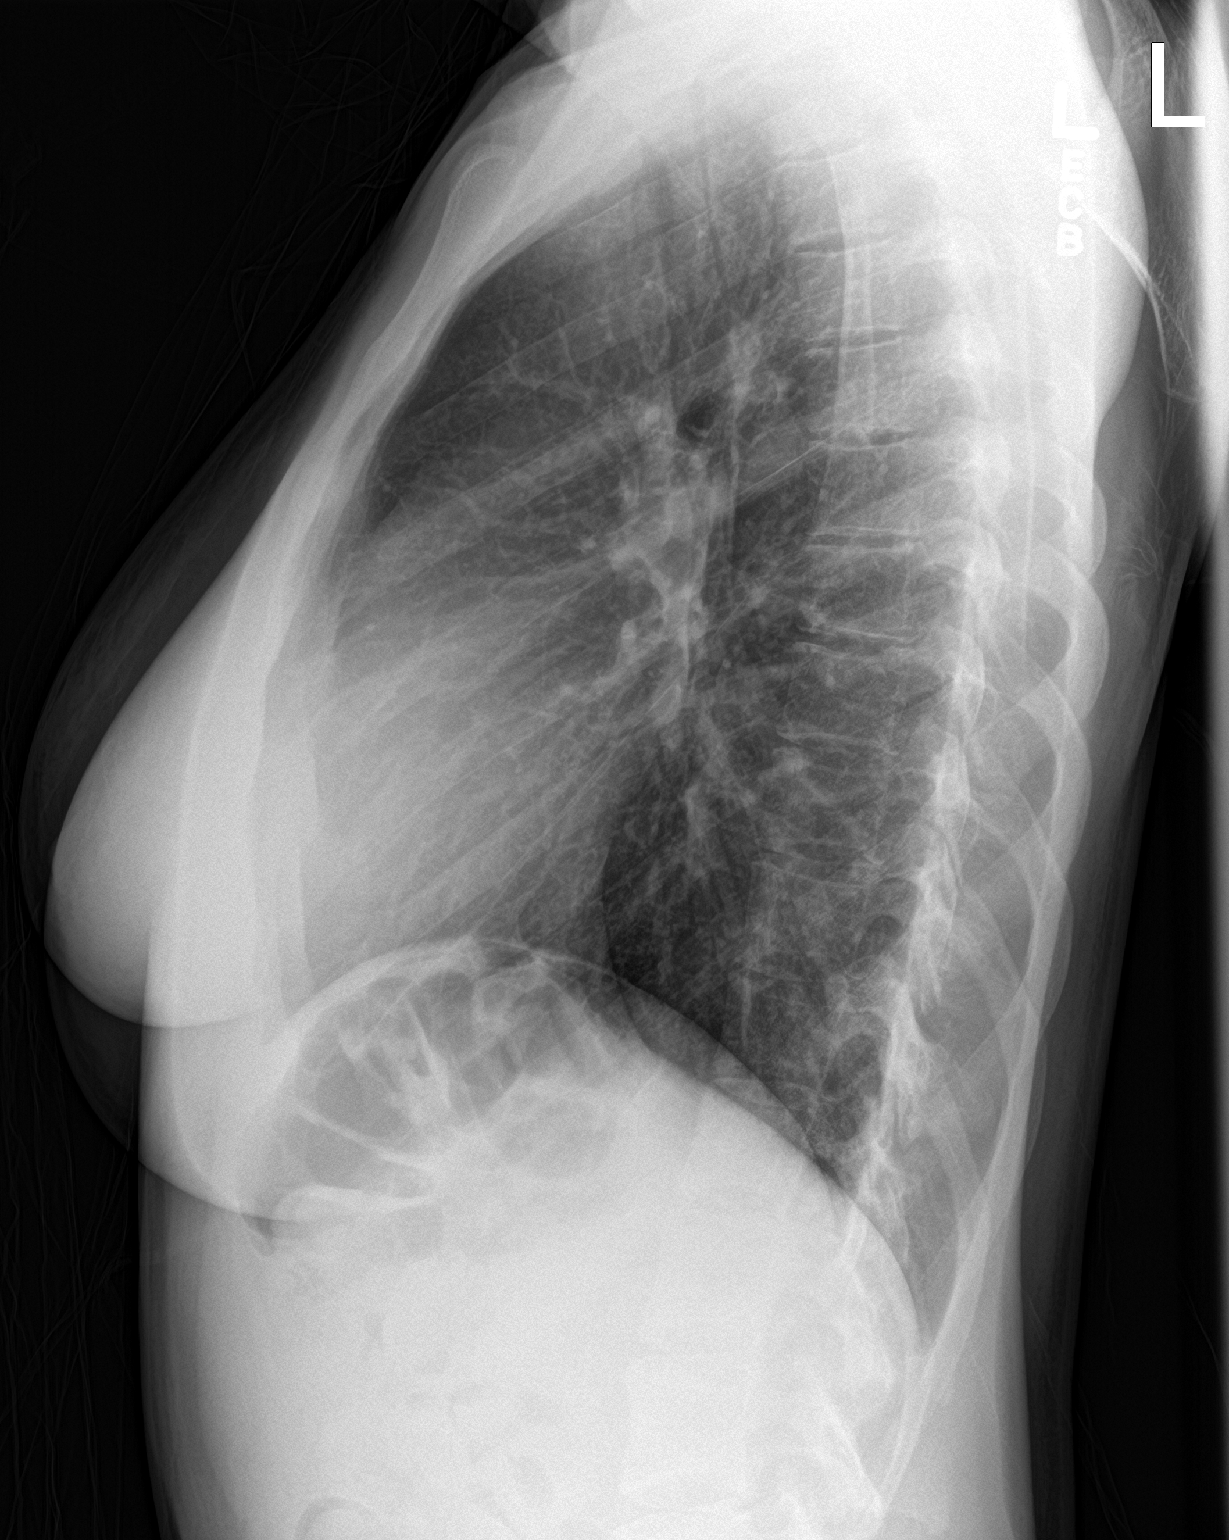

[2 of 2 positions shown; findings below may reference images not displayed]

FINDINGS: Midline trachea.  Normal heart size and mediastinal contours.

Sharp costophrenic angles.  No pneumothorax.  Clear lungs.
IMPRESSION: No active cardiopulmonary disease.
